# Patient Record
Sex: Male | Born: 1985 | Race: White | Hispanic: No | Marital: Married | Smoking: Never smoker
Health system: Southern US, Community
[De-identification: ages and names within clinical notes are randomized; demographics above are authoritative.]

## PROBLEM LIST (undated history)

## (undated) HISTORY — PX: TYMPANOSTOMY TUBE PLACEMENT: SHX32

## (undated) HISTORY — PX: HERNIA REPAIR: SHX51

## (undated) HISTORY — PX: ANKLE SURGERY: SHX546

---
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
## 2007-10-11 ENCOUNTER — Emergency Department (HOSPITAL_COMMUNITY): Admission: EM | Admit: 2007-10-11 | Discharge: 2007-10-12 | Payer: Self-pay | Admitting: Emergency Medicine

## 2007-10-12 ENCOUNTER — Encounter: Payer: Self-pay | Admitting: Family Medicine

## 2007-10-12 ENCOUNTER — Inpatient Hospital Stay (HOSPITAL_COMMUNITY): Admission: EM | Admit: 2007-10-12 | Discharge: 2007-10-18 | Payer: Self-pay | Admitting: Emergency Medicine

## 2007-10-13 ENCOUNTER — Encounter: Payer: Self-pay | Admitting: Family Medicine

## 2007-10-14 ENCOUNTER — Ambulatory Visit: Payer: Self-pay | Admitting: Internal Medicine

## 2007-10-15 ENCOUNTER — Encounter: Payer: Self-pay | Admitting: Family Medicine

## 2007-10-18 ENCOUNTER — Encounter: Payer: Self-pay | Admitting: Family Medicine

## 2007-10-19 DIAGNOSIS — Z87898 Personal history of other specified conditions: Secondary | ICD-10-CM

## 2007-10-20 ENCOUNTER — Ambulatory Visit: Payer: Self-pay | Admitting: Family Medicine

## 2007-10-22 ENCOUNTER — Telehealth (INDEPENDENT_AMBULATORY_CARE_PROVIDER_SITE_OTHER): Payer: Self-pay | Admitting: *Deleted

## 2007-10-25 ENCOUNTER — Telehealth: Payer: Self-pay | Admitting: Internal Medicine

## 2007-10-25 ENCOUNTER — Encounter: Payer: Self-pay | Admitting: Family Medicine

## 2008-02-07 ENCOUNTER — Ambulatory Visit: Payer: Self-pay | Admitting: Family Medicine

## 2008-02-08 ENCOUNTER — Telehealth: Payer: Self-pay | Admitting: Family Medicine

## 2008-02-08 ENCOUNTER — Encounter: Payer: Self-pay | Admitting: Family Medicine

## 2008-03-27 ENCOUNTER — Emergency Department (HOSPITAL_COMMUNITY): Admission: EM | Admit: 2008-03-27 | Discharge: 2008-03-27 | Payer: Self-pay | Admitting: Emergency Medicine

## 2008-04-24 ENCOUNTER — Encounter: Payer: Self-pay | Admitting: Family Medicine

## 2008-04-24 ENCOUNTER — Telehealth: Payer: Self-pay | Admitting: Family Medicine

## 2008-04-29 ENCOUNTER — Encounter: Payer: Self-pay | Admitting: Family Medicine

## 2008-05-03 ENCOUNTER — Telehealth: Payer: Self-pay | Admitting: Family Medicine

## 2008-08-04 ENCOUNTER — Ambulatory Visit: Payer: Self-pay | Admitting: Family Medicine

## 2008-08-04 LAB — CONVERTED CEMR LAB: Rapid Strep: NEGATIVE

## 2008-11-14 IMAGING — US US RENAL
1 series · 14 of 22 positions shown · non-contrast
Comparison: none

CLINICAL DATA: Left flank pain.  
 RENAL/URINARY TRACT ULTRASOUND:
TECHNIQUE: Complete ultrasound examination of the urinary tract was performed including evaluation of the kidneys, renal collecting systems, and urinary bladder.

[Series 1: unknown · 0.33mm/px · 14 of 22 slices shown]
[im 1/22]
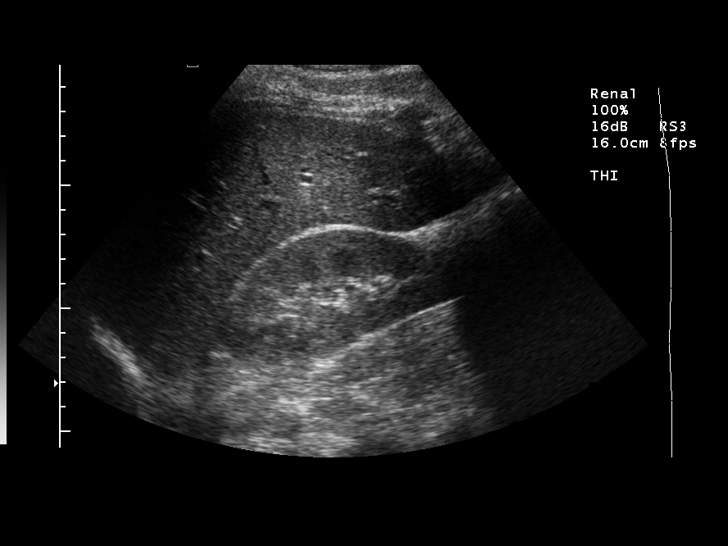
[im 3/22]
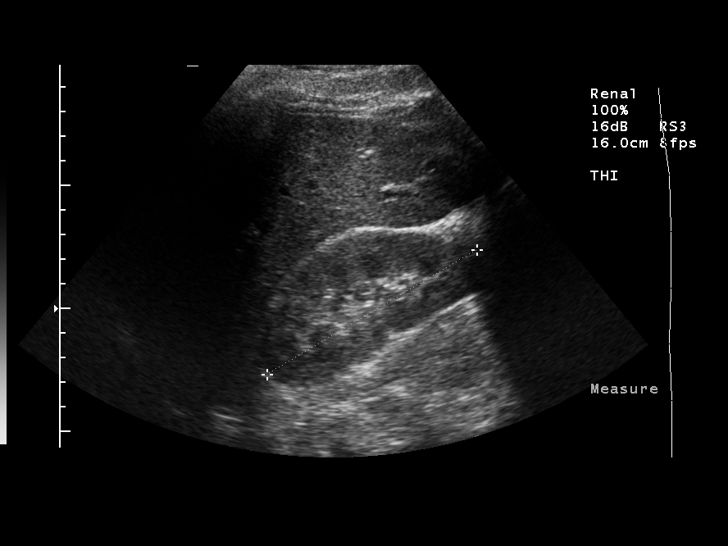
[im 4/22]
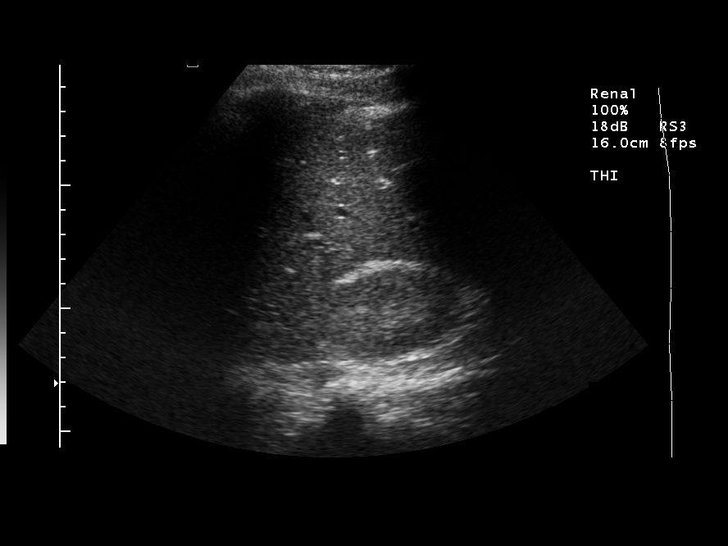
[im 6/22]
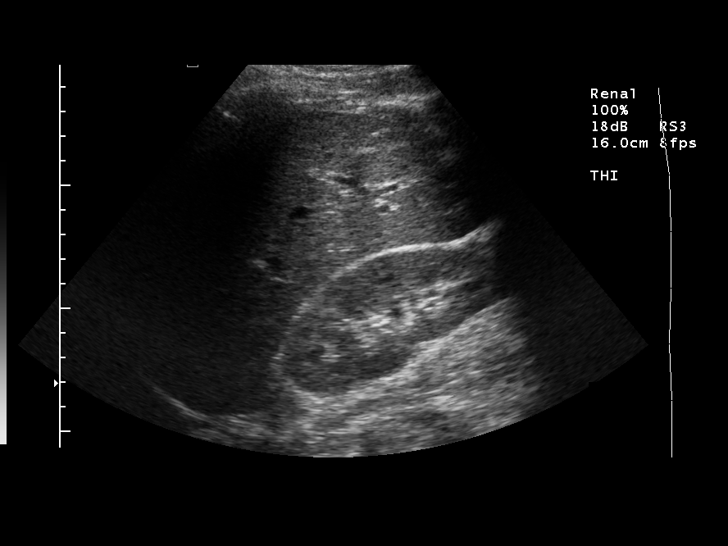
[im 8/22]
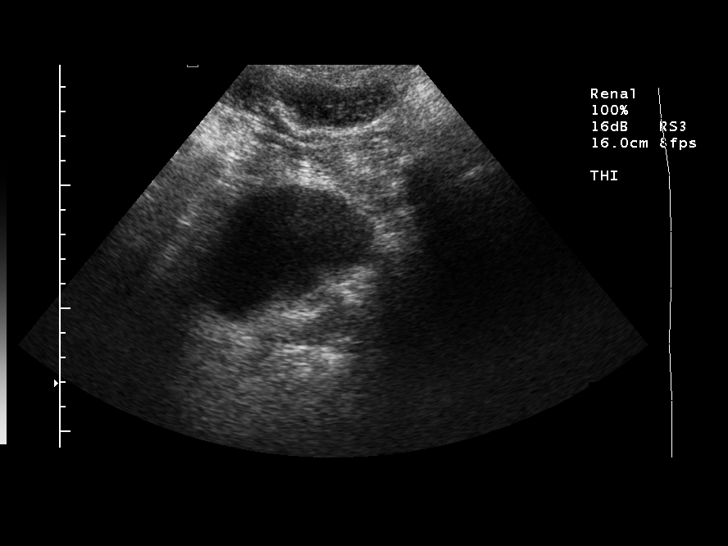
[im 9/22]
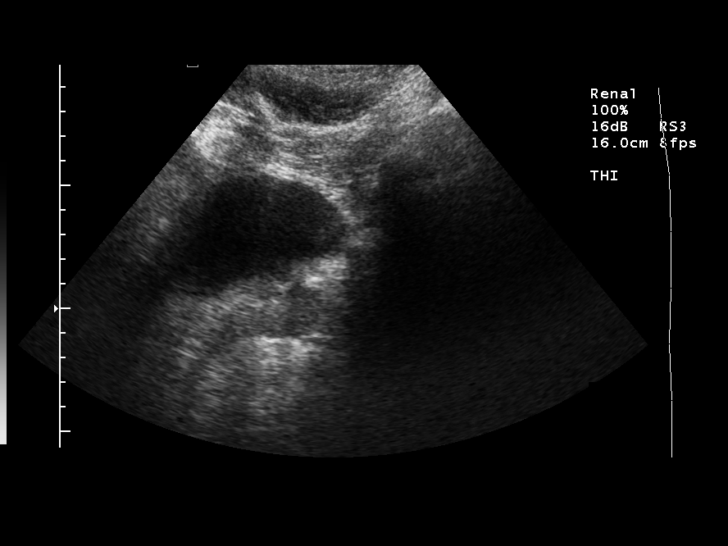
[im 11/22]
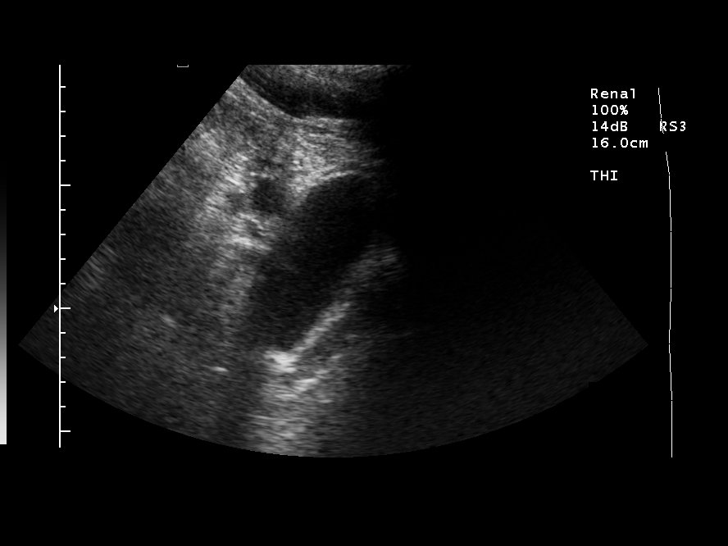
[im 12/22]
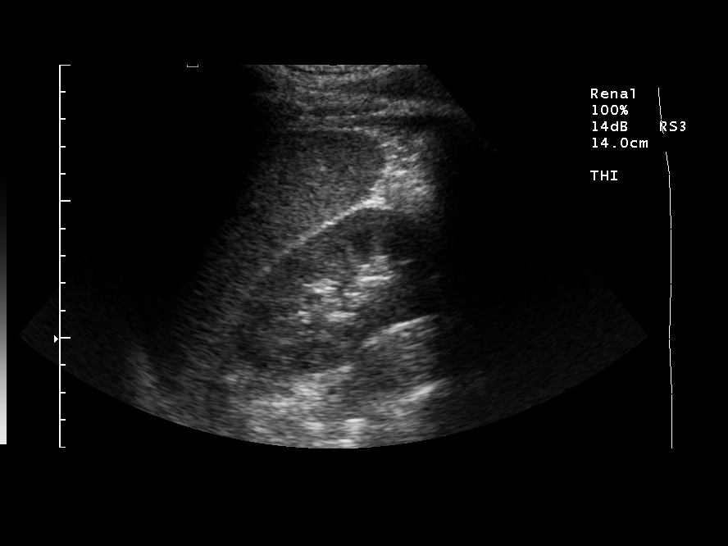
[im 14/22]
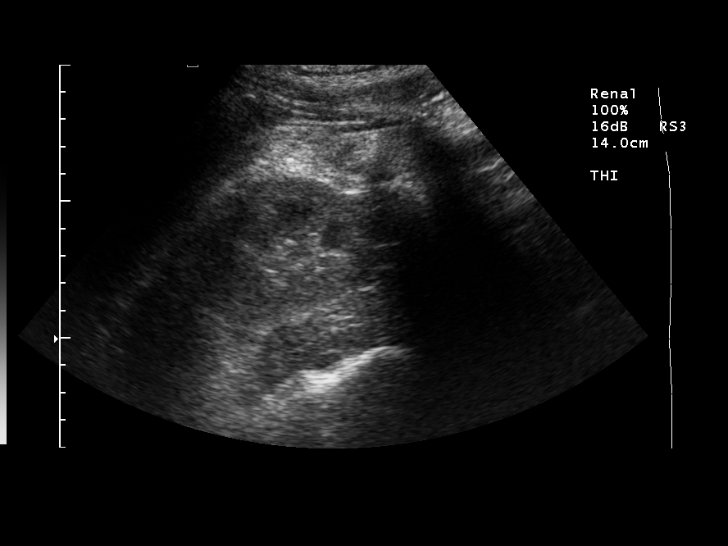
[im 15/22]
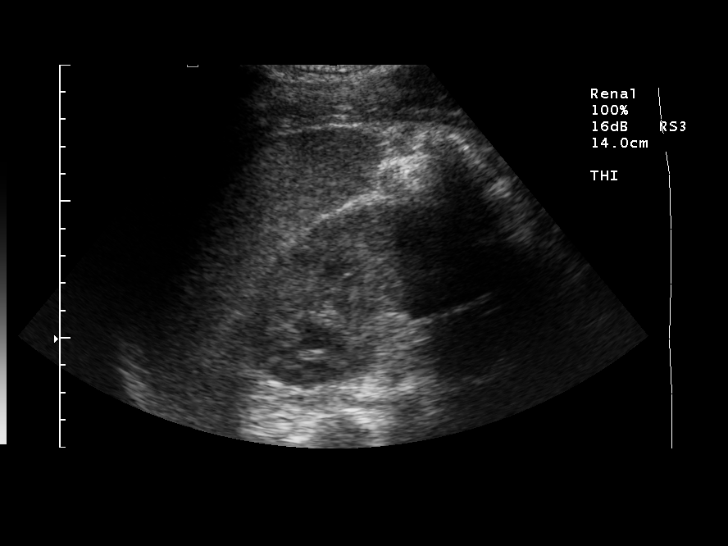
[im 17/22]
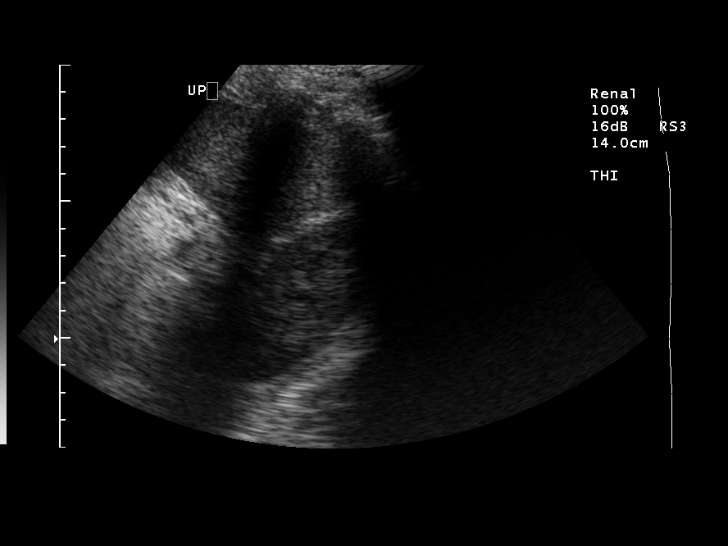
[im 19/22]
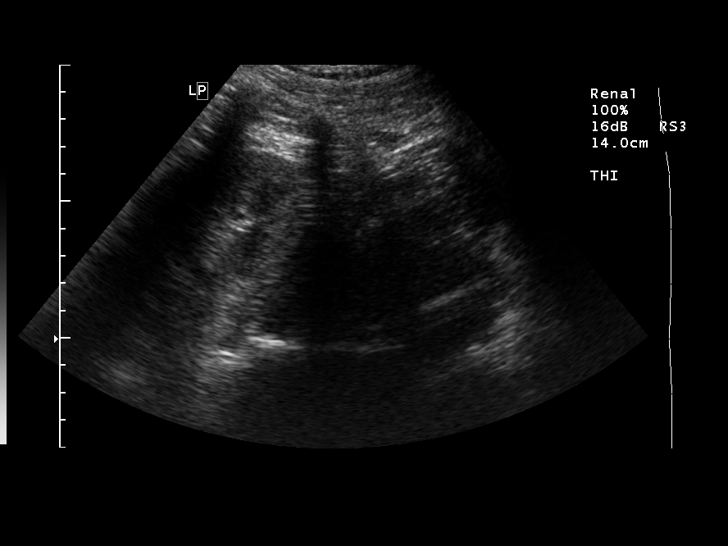
[im 20/22]
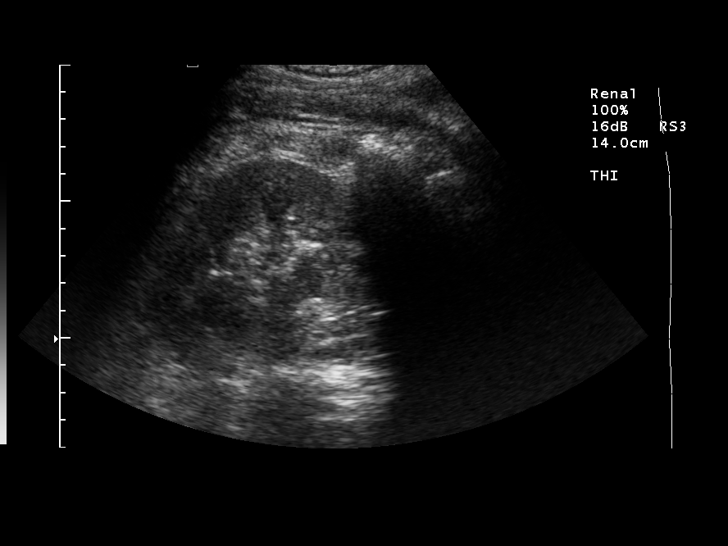
[im 22/22]
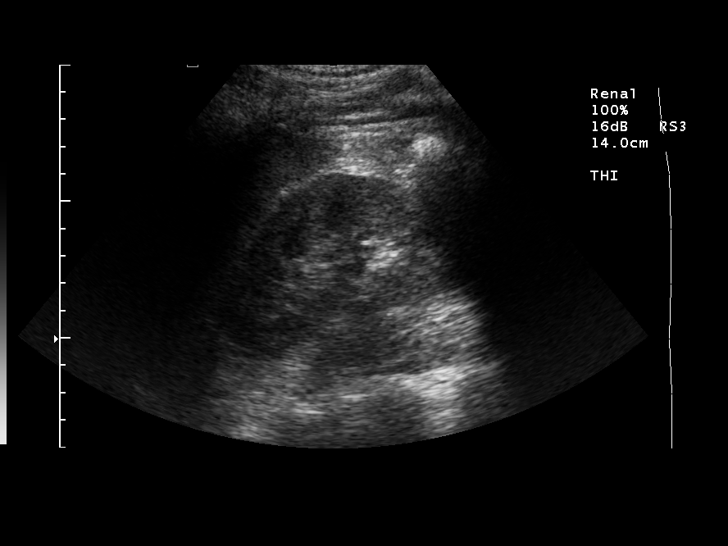

[14 of 22 positions shown; findings below may reference images not displayed]

FINDINGS: The right kidney is 10.1 cm in length and the left kidney 10.0 cm in length.  There is no hydronephrosis and there are no contour abnormalities.  The urinary bladder has a normal appearance.
IMPRESSION: Normal bilateral renal ultrasound.

## 2008-11-14 IMAGING — CT CT PELVIS W/ CM
2 of 5 series · 13 of 32 positions shown, 18 images · IV contrast (100 ML OMNI 300)
Comparison: none

CLINICAL DATA: Left abdominal and pelvic pain and flank pain. 
 ABDOMEN CT WITH CONTRAST:
TECHNIQUE: Multidetector CT imaging of the abdomen was performed following the standard protocol during bolus administration of intravenous contrast.
 Contrast:  100 cc Omnipaque 300 and oral contrast.
TECHNIQUE: Multidetector CT imaging of the pelvis was performed following the standard protocol during bolus administration of intravenous contrast.

[Series 2: routine abdomen · axial · 0.75mm/px · z∈[-480,-180]mm · 5 of 92 slices shown, 10 images]
[im 16/92  soft-tissue]
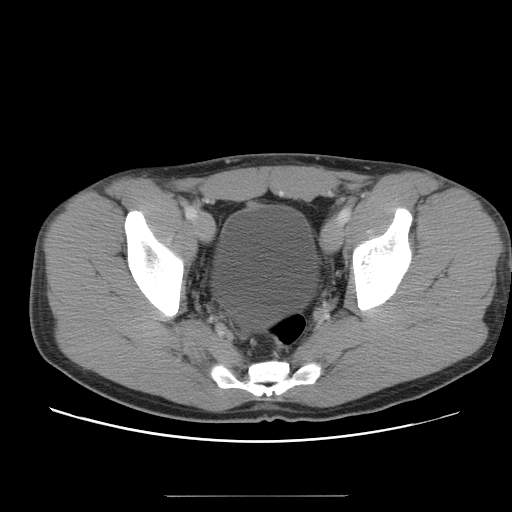
[im 16/92  bone]
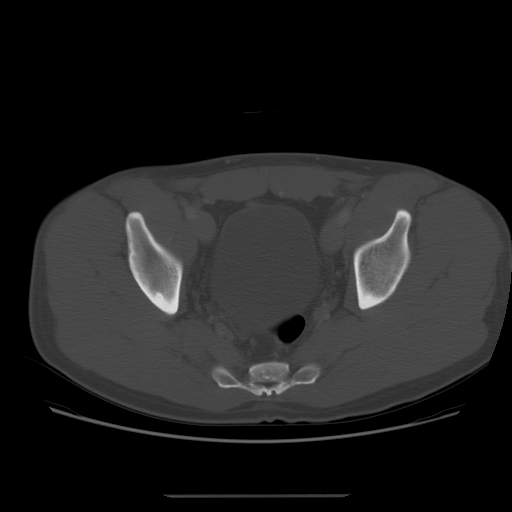
[im 31/92  soft-tissue]
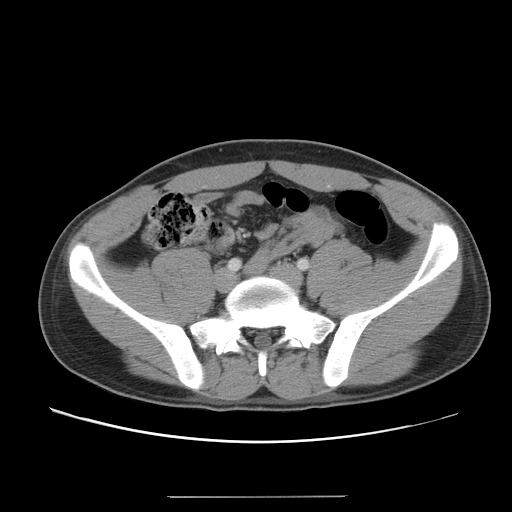
[im 31/92  lung]
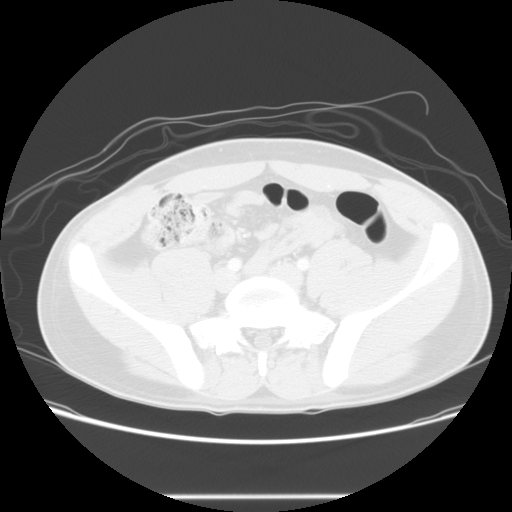
[im 46/92  soft-tissue]
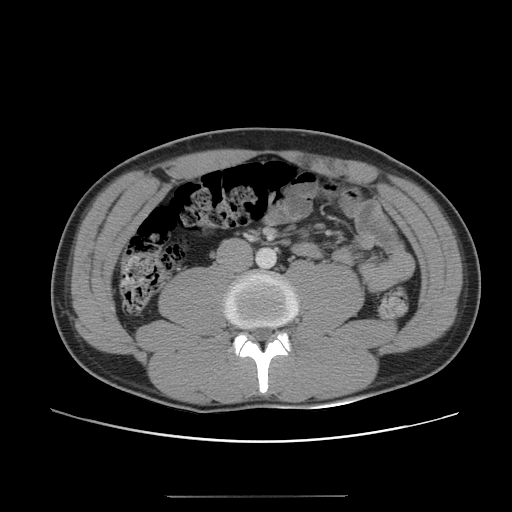
[im 46/92  lung]
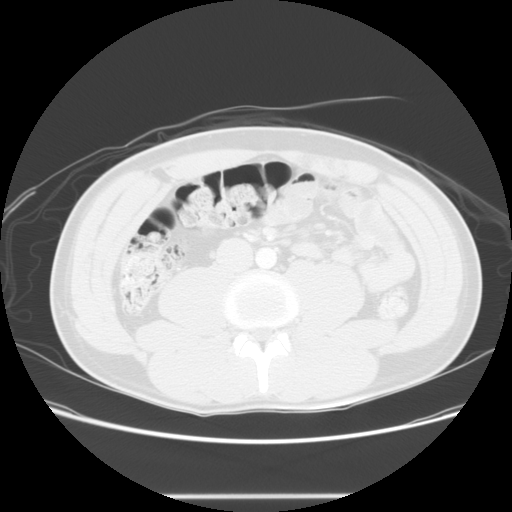
[im 61/92  soft-tissue]
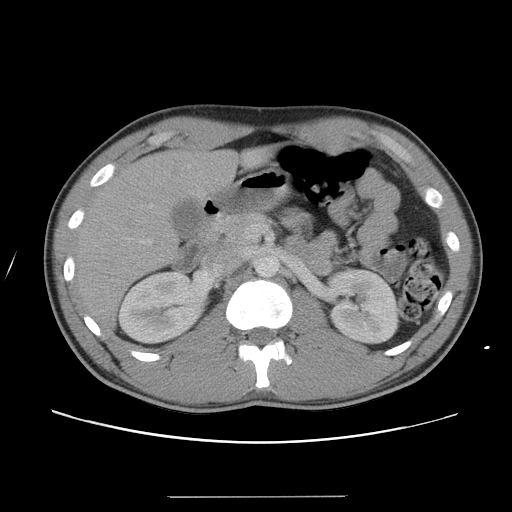
[im 61/92  lung]
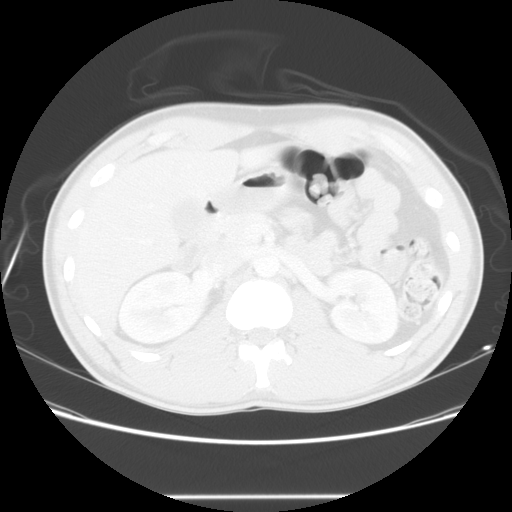
[im 76/92  soft-tissue]
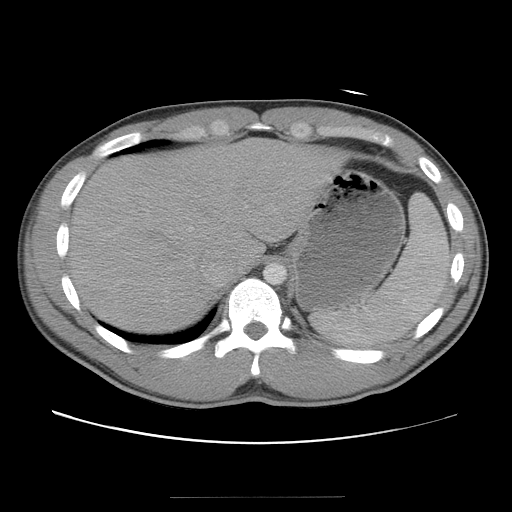
[im 76/92  lung]
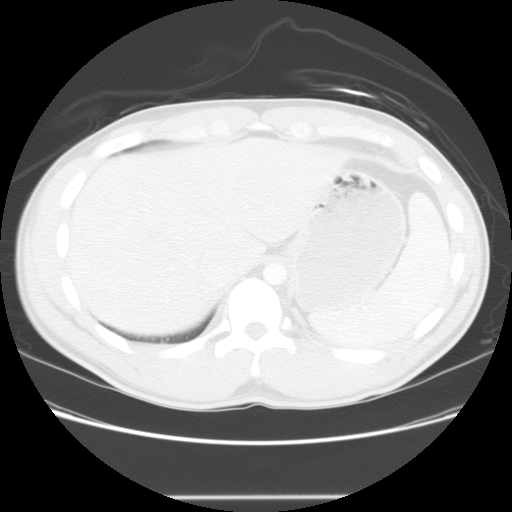

[Series 400: reformatted · sagittal · 0.91mm/px · 8 of 165 slices shown]
[im 15/165  soft-tissue]
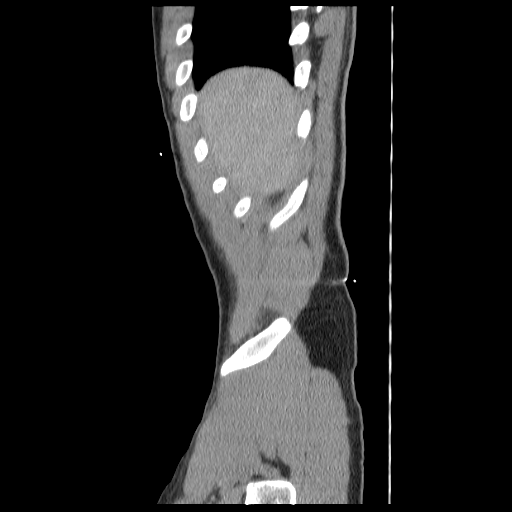
[im 30/165  soft-tissue]
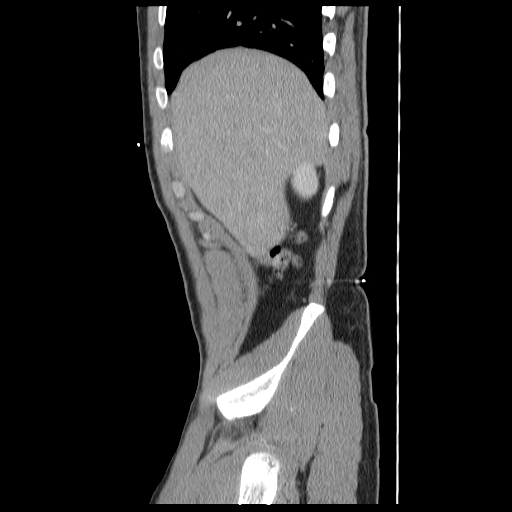
[im 60/165  soft-tissue]
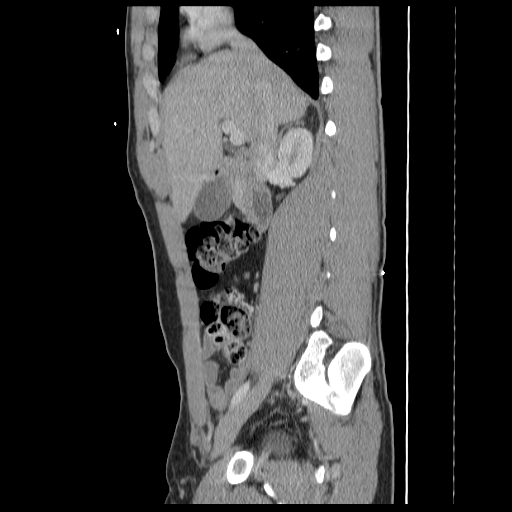
[im 75/165  soft-tissue]
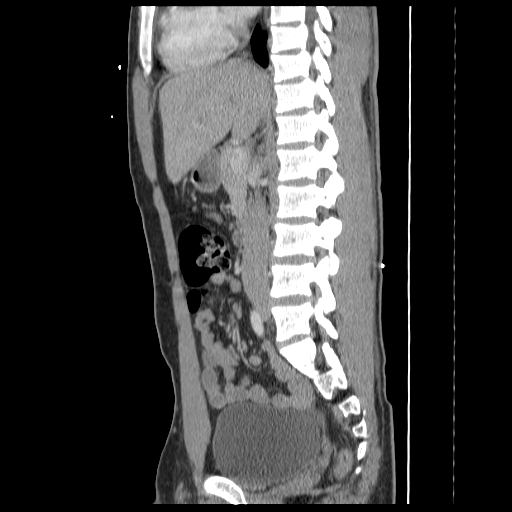
[im 90/165  soft-tissue]
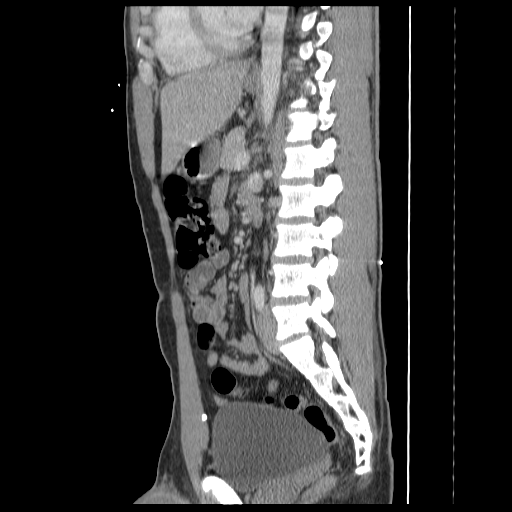
[im 105/165  soft-tissue]
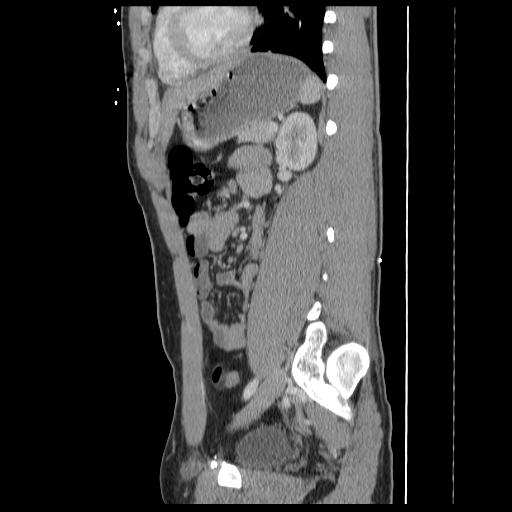
[im 135/165  soft-tissue]
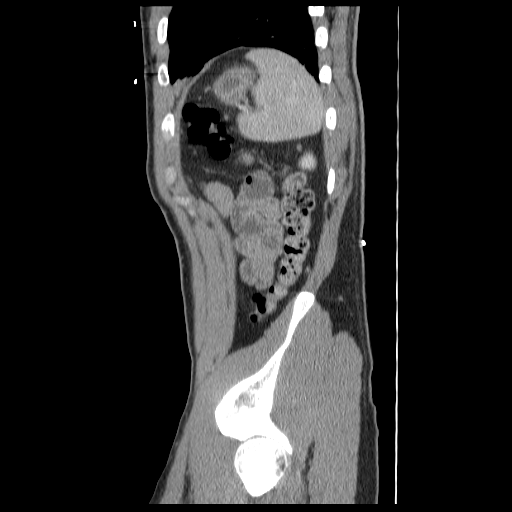
[im 150/165  soft-tissue]
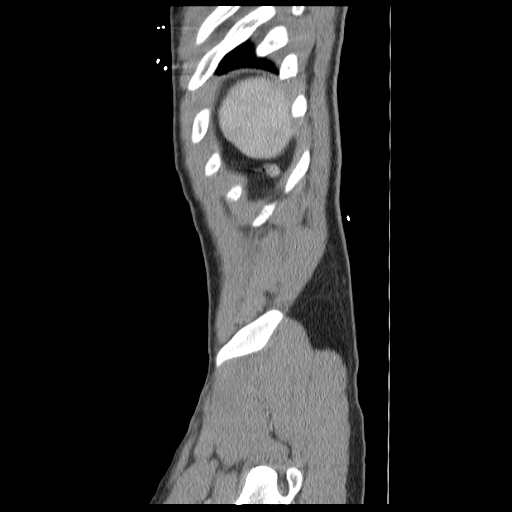

[13 of 32 positions shown; findings below may reference images not displayed]

FINDINGS: The liver, spleen, adrenal glands, kidneys, pancreas, and gallbladder are unremarkable.  The renal veins are patent.  There is no evidence of free fluid, enlarged lymph nodes, abdominal aortic aneurysm, or biliary dilatation.  Shotty mesenteric lymph nodes are likely reactive.  Visualized bowel is unremarkable.
IMPRESSION: No acute abnormalities. 
 PELVIS CT WITH CONTRAST:
FINDINGS: The bladder and bowel are unremarkable.  Evidence of pelvic wall graft noted.  No free fluid or enlarged lymph nodes.  The visualized bony structures are within normal limits.
IMPRESSION: No acute abnormalities.

## 2008-11-15 ENCOUNTER — Ambulatory Visit: Payer: Self-pay | Admitting: Family Medicine

## 2008-11-20 LAB — CONVERTED CEMR LAB: GC Probe Amp, Urine: NEGATIVE

## 2009-07-09 ENCOUNTER — Encounter: Payer: Self-pay | Admitting: Family Medicine

## 2009-07-10 ENCOUNTER — Observation Stay (HOSPITAL_COMMUNITY): Admission: EM | Admit: 2009-07-10 | Discharge: 2009-07-10 | Payer: Self-pay | Admitting: Emergency Medicine

## 2009-07-25 ENCOUNTER — Ambulatory Visit: Payer: Self-pay | Admitting: Family Medicine

## 2009-07-25 DIAGNOSIS — M549 Dorsalgia, unspecified: Secondary | ICD-10-CM | POA: Insufficient documentation

## 2009-12-11 ENCOUNTER — Telehealth: Payer: Self-pay | Admitting: Family Medicine

## 2009-12-26 ENCOUNTER — Encounter: Payer: Self-pay | Admitting: Family Medicine

## 2010-02-15 ENCOUNTER — Inpatient Hospital Stay (HOSPITAL_COMMUNITY): Admission: EM | Admit: 2010-02-15 | Discharge: 2010-02-16 | Payer: Self-pay | Admitting: Emergency Medicine

## 2010-02-16 ENCOUNTER — Encounter: Payer: Self-pay | Admitting: Family Medicine

## 2010-02-18 ENCOUNTER — Encounter: Payer: Self-pay | Admitting: Family Medicine

## 2010-02-19 ENCOUNTER — Encounter: Payer: Self-pay | Admitting: Family Medicine

## 2010-02-25 ENCOUNTER — Telehealth: Payer: Self-pay | Admitting: Family Medicine

## 2010-03-15 ENCOUNTER — Encounter: Admission: RE | Admit: 2010-03-15 | Discharge: 2010-03-15 | Payer: Self-pay

## 2010-12-03 NOTE — Letter (Signed)
Summary: Encounter Notice/[facility]  Encounter Notice/[facility]   Imported By: Lanelle Bal 02/27/2010 11:02:13  _____________________________________________________________________  External Attachment:    Type:   Image     Comment:   External Document

## 2010-12-03 NOTE — Miscellaneous (Signed)
  Clinical Lists Changes  Medications: Added new medication of PERCOCET 5-325 MG TABS (OXYCODONE-ACETAMINOPHEN) one tab by mouth every 4-6 hrs as needed muscle pain - Signed Rx of PERCOCET 5-325 MG TABS (OXYCODONE-ACETAMINOPHEN) one tab by mouth every 4-6 hrs as needed muscle pain;  #20 x 0;  Signed;  Entered by: Shaune Leeks MD;  Authorized by: Shaune Leeks MD;  Method used: Print then Give to Patient  Pt given script at [facility] on discharge, no str given. Script rewritten.  Shaune Leeks MD  February 19, 2010 2:50 PM   Prescriptions: PERCOCET 5-325 MG TABS (OXYCODONE-ACETAMINOPHEN) one tab by mouth every 4-6 hrs as needed muscle pain  #20 x 0   Entered and Authorized by:   Shaune Leeks MD   Signed by:   Shaune Leeks MD on 02/19/2010   Method used:   Print then Give to Patient   RxID:   [id number]

## 2010-12-03 NOTE — Consult Note (Signed)
Summary: [facility]  [facility]   Imported By: Lanelle Bal 01/11/2010 12:44:01  _____________________________________________________________________  External Attachment:    Type:   Image     Comment:   External Document

## 2010-12-03 NOTE — Letter (Signed)
Summary: [facility]-Dr. Theodosia Paling  [facility]-Dr. Theodosia Paling   Imported By: Maryln Gottron 02/28/2010 14:59:45  _____________________________________________________________________  External Attachment:    Type:   Image     Comment:   External Document

## 2010-12-03 NOTE — Progress Notes (Signed)
Summary: Discharge summary  Phone Note Other Incoming   Caller: [facility] pt encounter notification Summary of Call: Discharge summary you requested is on your shelf in the in box. Thank you.Lewanda Rife LPN  February 25, 2010 11:16 AM   Follow-up for Phone Call        I reviewed them and tagged to scan  thanks  Follow-up by: Judith Part MD,  February 25, 2010 12:59 PM

## 2010-12-03 NOTE — Progress Notes (Signed)
Summary: wants referral to  pain management  Phone Note Call from Patient Call back at [phone]   Caller: Patient Call For: Judith Part MD Summary of Call: Pt is asking for a referral to go to [facility] pain management clinic for a one time nerve block injection in his back.  He says he has had 2 injections in his back before, but not at this clinic. Initial call taken by: Lowella Petties CMA,  December 11, 2009 3:59 PM  Follow-up for Phone Call        will do ref and route to [facility]   Follow-up by: Judith Part MD,  December 11, 2009 4:39 PM  Additional Follow-up for Phone Call Additional follow up Details #1::        Referral form faxed to [facility].Patient called and notified that referral was done. Waiting for an appt. Carlton Adam  December 12, 2009 11:30 AM  Additional Follow-up by: Carlton Adam,  December 12, 2009 11:30 AM

## 2010-12-03 NOTE — Medication Information (Signed)
Summary: Rx for [facility]  Rx for [facility]   Imported By: Beau Fanny 02/20/2010 10:00:09  _____________________________________________________________________  External Attachment:    Type:   Image     Comment:   External Document

## 2011-01-21 LAB — POCT I-STAT, CHEM 8
BUN: 14 mg/dL (ref 6–23)
Calcium, Ion: 1.21 mmol/L (ref 1.12–1.32)
HCT: 46 % (ref 39.0–52.0)
Sodium: 142 mEq/L (ref 135–145)
TCO2: 27 mmol/L (ref 0–100)

## 2011-01-21 LAB — CULTURE, BLOOD (ROUTINE X 2)

## 2011-01-21 LAB — URINALYSIS, ROUTINE W REFLEX MICROSCOPIC
Glucose, UA: NEGATIVE mg/dL
Ketones, ur: NEGATIVE mg/dL
Nitrite: NEGATIVE
Protein, ur: NEGATIVE mg/dL
pH: 5.5 (ref 5.0–8.0)

## 2011-02-07 LAB — URINALYSIS, ROUTINE W REFLEX MICROSCOPIC
Glucose, UA: NEGATIVE mg/dL
Nitrite: NEGATIVE
Specific Gravity, Urine: 1.027 (ref 1.005–1.030)
pH: 6 (ref 5.0–8.0)

## 2011-02-07 LAB — DIFFERENTIAL
Eosinophils Absolute: 0.1 10*3/uL (ref 0.0–0.7)
Eosinophils Relative: 1 % (ref 0–5)
Lymphocytes Relative: 7 % — ABNORMAL LOW (ref 12–46)
Lymphs Abs: 0.8 10*3/uL (ref 0.7–4.0)
Monocytes Relative: 5 % (ref 3–12)

## 2011-02-07 LAB — CBC
HCT: 49.7 % (ref 39.0–52.0)
MCV: 90.2 fL (ref 78.0–100.0)
Platelets: 208 10*3/uL (ref 150–400)
RBC: 5.51 MIL/uL (ref 4.22–5.81)
WBC: 10.9 10*3/uL — ABNORMAL HIGH (ref 4.0–10.5)

## 2011-02-07 LAB — BASIC METABOLIC PANEL
BUN: 14 mg/dL (ref 6–23)
Chloride: 103 mEq/L (ref 96–112)
GFR calc Af Amer: >60 mL/min (ref >60)
GFR calc non Af Amer: >60 mL/min (ref >60)
Potassium: 3.6 mEq/L (ref 3.5–5.1)
Sodium: 136 mEq/L (ref 135–145)

## 2011-03-18 NOTE — Consult Note (Signed)
[name], [name]              ACCOUNT NO.:  [id number]   MEDICAL RECORD NO.:  [id number]          PATIENT TYPE:  INP   LOCATION:  6733                         FACILITY:  [facility]   PHYSICIAN:  Marlan Palau, M.D.  DATE OF BIRTH:  1986/10/18   DATE OF CONSULTATION:  10/15/2007  DATE OF DISCHARGE:                                 CONSULTATION   REFERRING PHYSICIAN:  Dr. Vikki Ports A. Leschber.   HISTORY OF PRESENT ILLNESS:  [name] is a 25 year old right-  handed white male, born 1986-03-24, with a history of problems with  the left side pain that has been present for about 5 days.  The patient  awoke with some left side discomfort 5 days prior to this consultation.  The patient has had intermittent pain since that time that comes on and  is quite intense for 20-40 minutes and then abates.  The patient may  have multiple episodes during the day.  The patient has had some reports  of low-grade fevers with the above events.  Patient has undergone a  multitude of tests to include a CT of the pelvis, CT of abdomen, renal  ultrasound and MRI scan of the thoracic spine, all of which have been on  remarkable.  CT angiogram of the chest has been negative to rule out  pulmonary embolism.  Urology has seen this patient, but no renal calculi  have been documented to explain his pain.  For this reason, neurology  consultation was obtained to rule out a neurologic cause of his  underlying pain problem.  The patient has been constipated and normally  has 1-2 bowel movements a day, but has not had any bowel movements for 5  days.  The patient had some problems with voiding the bladder at times.  No numbness of the extremities has been noted, no gait disturbance, no  weakness of extremities.   PAST MEDICAL HISTORY:  Significant for:  1. History of left side pain.  2. Left hernia surgery in the past.  3. Right foot tendon surgery in the past.  4. Tonsillectomy in the past.   CURRENT  MEDICATIONS:  1. Benadryl if needed.  2. Dilaudid p.r.n.  3. Toradol 10 mg q.6 h. if needed.  4. Robaxin 500 mg q.6 h.  5. Protonix 40 mg daily.  6. Flomax 0.4 mg nightly.  7. Diazepam if needed   ALLERGIES:  The patient has allergy to SULFA DRUGS.   HABITS:  Does not smoke or drink.   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   FAMILY MEDICAL HISTORY:  Both parents are alive and well; both have had  gallbladder resections.  Father has renal calculi, hypertension and has  had detached retina.  Maternal grandfather died of an MI at age 4.  History of hypertension is in the family.   REVIEW OF SYSTEMS:  Notable for some occasional low-grade fevers.  The  patient has had headaches associated with this severe left-sided pain.  Denies neck pain.  Denies shortness of breath, chest pains or true  abdominal pains, nausea or vomiting.  The patient has had some hesitancy  with the bladder.  Constipation as above.  Denies numbness or weakness  on the arms or leg, gait disturbance, blackout episodes or confusion  spells.   PHYSICAL EXAMINATION:  VITALS:  Blood pressure is 128/74, heart rate 55,  respiratory 20, temperature afebrile.  GENERAL:  This patient is a well-developed white male who is alert and  cooperative at the time of examination.  HEENT:  Head is atraumatic.  EYES:  Pupils are equal, round and reactive to light.  Disks are flat  bilaterally.  NECK:  Supple.  No carotid bruits are noted.  RESPIRATORY:  Clear.  CARDIOVASCULAR:  Examination reveals a regular rate and rhythm with no  obvious murmurs or rubs noted.  EXTREMITIES:  Without significant edema.  NEUROLOGIC:  Cranial nerves as above.  Facial symmetry is present.  The  patient has good sensation of face to pinprick and soft touch  bilaterally.  There is good strength of the facial muscles and muscles  of head turning and  shoulder shrugs bilaterally.  Speech is well  enunciated and not aphasic.  Motor testing reveals 5/5 strength in all  4's.  Good and symmetric motor tone is noted throughout.  Sensory  testing is intact to pinprick, soft touch and vibratory sensation  throughout.  Position sense is intact in all 4.  No evidence of  extinction is noted.  Patient has good finger-nose-finger and heel-to-  shin.  Gait was not tested.  No drift is seen.  Deep tendon reflexes are  symmetric and normal.  Toes are downgoing bilaterally.  No sensory  levels is noted on the back.  No significant pain to percussion in the  left flank is noted.   LABORATORY VALUES:  Notable for a white count of 6, hemoglobin 13.6,  hematocrit of 40.1, MCV of 88.2, platelets of 257,000; sed rate of 18;  sodium 139, potassium 3.5, chloride of 102, CO2 of 30, glucose of 123,  BUN of 3, creatinine 1.23, total bilirubin 1.5, alkaline phosphatase of  50, SGOT of 23, SGPT of 25, total protein 6.3, albumin of 3.1, calcium  9.3, amylase 51, lipase of 21, magnesium level of 21; CK level of 97,  LDH of 167, MB fraction of 0.8.   IMPRESSION:  Intermittent left side pain, etiology unclear.   This patient has intermittent severe pain that is associated with  relative pain-free periods.  The above pain problem is consistent with a  peristaltic pain that may be seen with renal calculi or with partial  bowel obstruction.  The patient has no evidence of neurologic disease,  has had a thoracic spine MRI that is unremarkable.  The sensory  examination is not consistent with radiculopathy or a zoster-type pain  syndrome.  The pain described is clearly a crampy, intermittent severe  pain.   PLAN:  I would recommend rather alleviating of constipation to see if  some of the peristaltic-type pain improves.  I will follow the patient's  course while in-house.  No further neurologic workup as indicated.      Marlan Palau, M.D.  Electronically  Signed     CKW/MEDQ  D:  10/15/2007  T:  10/17/2007  Job:  [id number]

## 2011-03-18 NOTE — Consult Note (Signed)
[name], [name]              ACCOUNT NO.:  [id number]   MEDICAL RECORD NO.:  [id number]          PATIENT TYPE:  INP   LOCATION:  6733                         FACILITY:  [facility]   PHYSICIAN:  Juan-Carlos Monguilod, M.D.DATE OF BIRTH:  1986-08-22   DATE OF CONSULTATION:  10/14/2007  DATE OF DISCHARGE:                                 CONSULTATION   REASON FOR CONSULTATION:  Pain management recommendations. This Nurse  Practitioner, Lorinda Creed, reviewed medical records, received report  from team, assessed patient, and then met with the patient and his  parents at the bedside to discuss pain management strategies.   GOALS/RECOMMENDATIONS:  1. Pain relief. Prior to this hospitalization, the patient has had no      pain at baseline.   RECOMMENDATIONS:  1. Toradol 10 mg 1 p.o. q.6 hours.  2. Robaxin 500 mg 1 p.o. q.6 hours.  3. K-pad.  4. Dilaudid IV 2 mg only for acute episodes of excruciating pain. May      be repeated in 15 minutes, not to exceed 10 mg in a 4 hour period.  5. Senna 2 tabs qhs.   HISTORY OF PRESENT ILLNESS:  This is a 26 year old white male who  developed acute left side and flank pain on Monday. He came to the  emergency room and was discharged home, only to return shortly and be  admitted. The patient describes the pain as excruciating and stabbing.  It comes on without warning. Between episodes, the area of concern is  reported to have a dull ache. The patient has been worked up quite  extensively and all imaging studies and x-rays have been negative. All  attempts at pain control and implemented. Palliative care will continue  to support holistically and assist in pain management attempts and  strategies.   PAST MEDICAL HISTORY:  1. Multiple bone fractures.  2. Inguinal hernia repair.  3. Tendon repair to the foot.  4. Adenoid surgery in the past.  5. Wisdom teeth removal.  6. Tubes as a child.   SOCIAL HISTORY:  The patient denies use of tobacco,  alcohol, or street  drugs of any kind. He is a Medical laboratory scientific officer.   FAMILY HISTORY:  Remarkable for his father who has a history of gout and  kidney stones. Mother has a history of pleurisy.   REVIEW OF SYSTEMS:  Per patient, he was perfectly healthy and had no  positive symptoms. Recent onset of left side and flank pain as noted  above.   ALLERGIES:  SULFA, MORPHINE.   MEDICATIONS:  Valium, hydromorphone, Flomax, Dulcolax, Toradol,  Lorazepam, Protonix, and Pyridium.   PHYSICAL EXAMINATION:  GENERAL:  This is a well developed white male who  is sitting up in bed. Alert and oriented times three.  VITAL SIGNS:  Blood pressure 125/63, temperature 97.9, pulse 80,  respiratory rate 20. O2 saturations on room air 97%.  HEENT:  Normocephalic. Pupils are equal and reactive.  LUNGS:  Clear to auscultation.  ABDOMEN:  Soft, nontender, positive bowel sounds. The patient reports an  area directly below the left axilla, midway between axilla and hip  bone.  Area measures approximately 18 cm x 10 cm that on palpation, generates  tenderness.  EXTREMITIES:  Without erythema or edema.   LABORATORY DATA:  As of October 14, 2007, sodium 139, potassium 3.5,  chloride 102, CO2 30, glucose 123, BUN 3, creatinine 1.25, bilirubin  1.25. Total protein 6.3. Albumin blood 3.1. Calcium 9.3. White blood  cell count 6.0. Red blood cells is 4.55. Hemoglobin 13.6. Hematocrit  40.1. Platelets 257,000.   DURATION OF CONSULTATION:  Total time spent on the unit, 90 minutes.  Counseling and coordination of care composed of 50% of greater portion  of this interaction. Concept of pain management strategies were  discussed. Values and goals of care important to the family and patient  were elicited. Palliative care will support holistically and attempt to  assist in coordination of care for this patient while hospitalized.  Family is encouraged to call with questions or concerns.      Herbert Pun,  NP      Rosanne Sack, M.D.  Electronically Signed    MCL/MEDQ  D:  10/15/2007  T:  10/17/2007  Job:  [id number]   cc:   [facility]
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
## 2011-03-18 NOTE — Consult Note (Signed)
[name], [name]              ACCOUNT NO.:  [id number]   MEDICAL RECORD NO.:  [id number]          PATIENT TYPE:  INP   LOCATION:  6733                         FACILITY:  [facility]   PHYSICIAN:  Currie Paris, M.D.DATE OF BIRTH:  [birth year]   DATE OF CONSULTATION:  10/15/2007  DATE OF DISCHARGE:                                 CONSULTATION   REFERRING PHYSICIAN:  Dr. Vikki Ports A. Leschber.   PRIMARY CARE PHYSICIAN:  Dr. Milinda Antis.   UROLOGIST:  Dr. Logan Bores.   NEUROLOGIST:  Dr. Anne Hahn.   ORTHOPEDIST:  Dr. Madelon Lips.   REASON FOR CONSULTATION:  Unexplained left flank pain in patient with  history of prior left inguinal hernia repair with mesh in 2007.   HISTORY OF PRESENT ILLNESS:  [name] is an otherwise healthy 25-  year-old male who works for the [employer].  He awakened from his  sleep Monday morning because of severe left flank pain, very focal,  nonradiating, increased with breathing and was very sharp in nature.  He  attempted to go to work, was taking Tylenol with no real relief in pain,  was able to work without any worsening of the pain, but pain seemed to  become much worse as the evening wore on and he was eventually taken to  the emergency room for evaluation.  It was felt at that time he had of  musculoskeletal pain, so he was discharged.  Since that time, the  patient has had no resolution in pain.  The pain has worsened to the  point where the patient had significant shortness of breath and anxiety  because of the pain, simply because of pain would cause him to have an  inability to take in a good deep breath and he felt like he could not  breathe.  He was subsequently admitted by the internist on October 12, 2007.  Since that time, he has had an extensive workup involving ruling  out pulmonary embolus, urological etiology, intra-abdominal pathology.  He has had CTs done and ultrasounds done.  He has had multiple  consultations including with Orthopedics,  Urology and Neurology.  The  neurology evaluation was today and so far, no clear etiology to the pain  has been discovered.  He is scheduled to undergo repeat MRI today,  somewhat more detailed in the spinal area, as ordered per Neurology.  According to the patient, the patient is still having pretty much the  same pain.  He is using NSAIDS, muscle relaxants and narcotic pain  medications without any significant relief.  He is just very drowsy and  tired and again, he mainly notes it with taking in a deep breath. This  pain does not radiate down the leg, down the buttocks, does not radiate  across the lower back, does not radiate across the lower abdomen or into  the groin region.  He again describes it as a very sharp burning pain.  The patient had prior left inguinal hernia repair done in December 2007.  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  The patient did have a  laparoscopic repair with mesh in December 2007 by Dr. Ezzard Standing and has not  reported any symptoms such as discomfort or pain or bulging in the left  groin area.  In addition, the patient does have a history of prior  chickenpox, but has never had problems related to zoster or herpetic  neuralgia.   REVIEW OF SYSTEMS:  As above.  No nausea or vomiting.  No bloating.  No  dark or red stools.  No fevers.  No hematuria.  No dysuria.  No pain or  increase or change in back pain with voiding.   PAST MEDICAL HISTORY:  None.   PAST SURGICAL HISTORY:  1. Right pneumothorax.  2. Prior extremity fractures from other injuries/sports.  3. Left inguinal hernia repair, laparoscopic approach, with mesh in      December 2007 by Dr. Ovidio Kin.  4. Prior wisdom teeth extraction.  5. Tender repair of foot.  6. Myringotomy tubes as a child.   SOCIAL HISTORY:  He lives with his parents.  No alcohol.  No tobacco.   ALLERGIES:  SULFA and MORPHINE.   CURRENT  MEDICATIONS:  Include Colace, Flomax, Protonix, IV Dilaudid,  Toradol, Robaxin, Senokot, Tylenol, Zofran, Vicodin, Ativan, Valium and  Haldol as needed.   PHYSICAL EXAM:  GENERAL:  A pleasant, somewhat sedated male patient  relating frustration regarding ongoing left flank pain, very focal in  nature without any radiculopathy-type symptoms.  VITAL SIGNS:  Temperature 98.4, BP 160/89, pulse 73 and regular,  respirations 20.  NEUROLOGIC:  The patient is alert and oriented x3, moving all  extremities x4 without focal deficits.  Gait, ambulation and DTRs were  not assessed.  HEENT:  Head is normocephalic.  Sclerae not injected, anicteric.  NECK:  Supple.  No adenopathy.  CHEST:  Bilateral lung sounds clear to auscultation.  Respiratory effort  is nonlabored.  He is on room air.  CARDIAC:  S1-S2, no obvious rubs, murmurs or thrills.  No gallops.  No  JVD.  Pulses regular and non-tachycardiac.  ABDOMEN:  Soft, nontender and nondistended.  No obvious  hepatosplenomegaly, masses or bruits and no lower abdominal hernias  noted.  He has a tiny scars in the left lower quadrant consistent with  prior laparoscopic procedure.  EXTREMITIES:  Symmetrical in appearance without edema, cyanosis or  clubbing.  GENITOURINARY:  The patient has an obvious left inguinal ring defect  which is open on exam; I am able to insert the tips of 2 fingers up  through this defect and up into the inguinal canal at least 2 cm and I  am able to palpate bowel; this causes tenderness to the patient, but  does not reproduce the left flank pain or may the left flank pain any  worse.  This area does not appear to be incarcerated and is only tender  with aggressive pushing up into the inguinal canal; when the fingers are  removed, there is no more pain.  The right inguinal has no defects.  The  testes and scrotum are normally descended and normal in appearance and  he has normal hair distribution in the pelvic area.   On my  exam, the patient has a prominent external ring, but no hernia.  (STRECK)   LABORATORY DATA:  White count 6000, hemoglobin 13.6, platelets 257,000.  Potassium 3.5, creatinine 1.23, total bilirubin 1.5.  Urinalysis has  been completely normal x3 collections.   DIAGNOSTICS:  He has had  multiple procedures done this admission.  A CT  and the abdomen and pelvis with oral and IV contrast shows no acute  abdominal processes, no obvious areas of herniation or entrapment  visible, but a stable pelvic wall graft, no mention again of any type of  herniation or obstructive process.   IMPRESSION:  1. Left flank pain, very focal, uncertain etiology.  2. Left inguinal hernia with inguinal ring defect, status post prior      laparoscopic repair with mesh in 2007.   PLAN:  1. The patient does not have a henia.  The palpation in the inguinal      canal does not reproduce any left flank pain, so I do not feel this      is the cause of the left flank pain at this time.  Dr. Jamey Ripa will      discuss with Dr. Ezzard Standing.  The patient probably would benefit from      outpatient reevaluation for open repair later.  2. The patient has a history of prior chickenpox and has never had      herpes zoster.  Symptoms have only been present for 5 days, so this      could be early presentation of herpetic zoster pain.  Would      continue to monitor for eruption of vesicles.  3. At this time, I see no general surgical etiology for the pain, no      new recommendations other than elective follow up regarding the      hernia.      Allison L. Rennis Harding, N.P.      Currie Paris, M.D.  Electronically Signed    ALE/MEDQ  D:  10/15/2007  T:  10/17/2007  Job:  [id number]   cc:   Sandria Bales. Ezzard Standing, M.D.  Marne A. Milinda Antis, MD

## 2011-03-18 NOTE — Discharge Summary (Signed)
[name], [name]              ACCOUNT NO.:  [id number]   MEDICAL RECORD NO.:  [id number]          PATIENT TYPE:  INP   LOCATION:  6733                         FACILITY:  [facility]   PHYSICIAN:  Valerie A. Felicity Coyer, MDDATE OF BIRTH:  [birth year]   DATE OF ADMISSION:  10/12/2007  DATE OF DISCHARGE:  10/18/2007                               DISCHARGE SUMMARY   DISCHARGE DIAGNOSES:  1. Left sided paroxysmal flank pain, severe with unexplained etiology,      negative evaluation.  Please see review of test below.  2. History of left inguinal hernia repair.  3. Constipation and urinary retention presumably exacerbated by      narcotics and anxiety, resolved.  4. History of pneumothorax with multiple fractures.  5. History of adenoid surgery and wisdom teeth extraction.   DISCHARGE MEDICATIONS:  1. Flomax 0.4 mg once daily, dispense 30.  2. Indocin 25 mg one t.i.d. x3 days, then p.r.n., dispense 30.  3. Robaxin 500 mg one q.4-6 h. p.r.n. muscle spasm, dispense 40.  4. Protonix 40 mg once daily while on Indocin to protect stomach,      dispense 30.  5. Valium 5 mg one t.i.d. p.r.n. anxiety or spasm, dispense 20.  6. Norco 5/325 one q.4 h. p.r.n. moderate pain, dispense 20.  7. Dilaudid 2 mg one q.4 h. p.r.n. severe pain, dispense 20.  8. Stool softener over-the-counter (Dulcolax, Senna, etc.) one to two      at night as needed while on pain medication.   FOLLOW UP:  Hospital followup is scheduled with primary care physician,  Dr. Roxy Manns, for Wednesday this week, December 17 at 3:30 p.m., to  review the patient's pain symptoms.  The patient is also given a  prescription for outpatient PT and will call Delbert Harness to arrange.  Prescription has been provided for PT to evaluate and treat for left  flank pain.  Telephone number [phone].  The patient will also follow up  with neurologist, Dr. Logan Bores, on an as-needed basis.  Dr. Thurston Hole as  needed.   CONSULTATIONS:  1. Dr. Logan Bores of  urology.  2. Lorinda Creed, NP, of hospice and palliative care for pain      management.  3. Dr. Lesia Sago, neurology.  4. Dr. Cyndia Bent, general surgery.  5. Dr. Madelon Lips, on-call for Dr. Thurston Hole, for orthopedics.   PROCEDURES:  1. CT of abdomen and pelvis, first without and then with contrast each      negative.  2. Normal renal ultrasound.  3. Normal CT angiogram of the chest, negative for pulmonary embolus.  4. Normal thoracic spine.  5. Normal plain films of the lumbar spine and chest x-ray.  6. Negative MRI of the lumbar spine.  7. Bedside cystogram for urinary retention December 11, by Dr. Logan Bores,      unremarkable.   CONDITION ON DISCHARGE:  Medically stable.  No hemodynamic or  respiratory compromise and no episodes of his unexplained severe pain  for the last 36 hours.  PT evaluation pending at time of dictation.  Anticipate stable for discharge for outpatient PT evaluation and  treatment.   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  He underwent a left inguinal hernia repair in 2007, as well  as has a history of pneumothorax associated with chest trauma and  fractures.  He had been evaluated in the emergency room the day prior  for sharp paroxysmal spasm-like pain concerning for kidney stone.  On  December 8, a noncontrasted CT abdomen and pelvis was performed and  found no evidence of same.  The urinalysis was negative.  Other labs  were unremarkable and he had a normal D-dimer.  He was therefore sent  home for medical management given a prescription for Percocet, Valium  and Reglan.  However, within several hours of returning home, he had a  recurrence of the severe, left-sided flank pain which awoke him from  sleep, became worse with movement and unable to lay flat or catch his  breath, prompting return to the emergency room.  The pain was 5/10,   stabbing in nature across the left side.  Laboratory data was again  unremarkable with a negative D-dimer x2 sets, but he was referred for  admission for further evaluation and pain management.  We checked an MRI  of the thoracic spine to rule out spinal involvement which was  unremarkable.  Family remained concerned with possible kidney stone as  the patient in the next 24 hours following hospitalization developed  urinary retention made worse by his anxiety and narcotics.  A call was  made to Dr. Logan Bores who saw the patient later that evening and agreed  there was no apparent evidence for kidney stone and ordered a CT of the  abdomen and pelvis with contrast to rule out other intra-abdominal  pathology.  This was negative.  A renal ultrasound also was performed  that showed no obstruction or other abnormality.  It was felt this was  most likely musculoskeletal in nature and planned for a bedside flexible  cystogram which was performed December 11, and had a negative GU  evaluation.  It was felt dysfunctional voiding was due to his narcotics  and anxiety and thus, the family requested continued questionable of  possible musculoskeletal causes of pain.  In his interim, he had been  moved to step-down unit for IV Dilaudid PCA due to his uncontrolled  symptoms of pain with these spasm attacks on the floor.  After urology  evaluation, he was felt stable to return to the floor for continued pain  management having had a negative evaluation and the assistance of  palliative care medicine on behalf of Lorinda Creed, NP was enrolled.  Orthopedics was consulted December 12, to look for possible other  musculoskeletal causes of his symptoms.  They ordered a MRI of the L-  spine as well as CPK, aldolase and LDH by Dr. Madelon Lips and these were  also negative.  Neurology was consulted, but felt there was no evidence  for neurologic causes of pain given his negative evaluation and general  surgery also saw  the patient on December 12, by Junious Silk, NP and  Dr. Jamey Ripa, who found no surgical cause for pain.  The patient was  treated symptomatically with decreasing doses of IV Dilaudid and the  addition of Lidoderm patch.  He was treated continuously with anti-  inflammatory in the form of oral Toradol as well as Robaxin and/or  Valium for muscle relaxant.  There was no  evidence of herpetic shingles  and eventually the number and intensity of these spasms decreased over  the weekend.  At this time, having had a negative evaluation by multiple  subspecialties, negative imaging test and normal lab evaluation with the  resolution and control of his symptoms on current medical management,  the patient is stable for discharge home.  He will be seen by physical  therapy prior to discharge to ensure no triggering recurrence with  activity.  We will recommend for an outpatient PT evaluation and treat  which family will arrange with former orthopedic Kalyan Barabas at [facility].  They have been given a prescription for outpatient PT as well  as prescriptions for the above-listed medical management.  Close  outpatient followup with primary MD, Dr. Roxy Manns, has been arranged  for the next 48 hours to review the patient's symptoms and outpatient  management.  She will also of her scheduled time to return to work  depending on his symptoms and course post discharge.  I have also had a  long discussion with the patient and his mother regarding the thorough  negative evaluation here with consideration of outpatient evaluation at  tertiary center should there be recurrence or still unanswered questions  following discharge.   Greater than 30 minutes spent on time of discharge planning, review of  information and family/patient discussion.      Valerie A. Felicity Coyer, MD  Electronically Signed     VAL/MEDQ  D:  10/18/2007  T:  10/19/2007  Job:  [id number]
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
## 2011-03-18 NOTE — Assessment & Plan Note (Signed)
[facility]                                 ON-CALL NOTE   [name], [name]                       MRN:          [id number]  DATE:10/13/2007                            DOB:          [birth year]    TIME OF CALL:  8:11 p.m.   PHONE NUMBER:  [phone]   CALLER:  [name], his father.   CHIEF COMPLAINT:  Questions in the hospital.  The patient's father is  calling from [facility].  He is my regular patient.  He said that he has  been in [facility] since Monday.  They recently moved him to  critical care room 7306.  They have questions about his care.  He has  had abdominal pain since admission and they have not figured out what it  is yet.  Possibly a kidney stone.  They were not sure.  They need an  update from a doctor tonight and wanted someone to give them a call.  I  called the answering service and had them direct the page to Dr. Drue Novel,  who is on call for the hospital for hospital coverage tonight to call  them back.     Marne A. Tower, MD  Electronically Signed    MAT/MedQ  DD: 10/13/2007  DT: 10/14/2007  Job #: [id number]

## 2011-08-11 LAB — CBC
HCT: 38.5 — ABNORMAL LOW
HCT: 39
HCT: 40.1
Hemoglobin: 13.3
Hemoglobin: 13.5
Hemoglobin: 13.6
MCHC: 33.4
MCHC: 33.8
MCHC: 34.6
MCV: 86.5
MCV: 87
MCV: 88.2
Platelets: 251
Platelets: 257
RBC: 4.55
RBC: 5.1
RDW: 12.1
RDW: 12.6
WBC: 10
WBC: 10.5
WBC: 6

## 2011-08-11 LAB — ACETAMINOPHEN LEVEL: Acetaminophen (Tylenol), Serum: 12.3

## 2011-08-11 LAB — ALDOLASE: Aldolase: 8.5 U/L — ABNORMAL HIGH (ref <=8.1)

## 2011-08-11 LAB — URINALYSIS, ROUTINE W REFLEX MICROSCOPIC
Bilirubin Urine: NEGATIVE
Glucose, UA: NEGATIVE
Glucose, UA: NEGATIVE
Glucose, UA: NEGATIVE
Hgb urine dipstick: NEGATIVE
Hgb urine dipstick: NEGATIVE
Ketones, ur: NEGATIVE
Ketones, ur: NEGATIVE
Ketones, ur: NEGATIVE
Nitrite: NEGATIVE
Nitrite: NEGATIVE
Protein, ur: NEGATIVE
Protein, ur: NEGATIVE
Specific Gravity, Urine: >1.046 — ABNORMAL HIGH
Urobilinogen, UA: 0.2
Urobilinogen, UA: 1
pH: 5.5
pH: 7

## 2011-08-11 LAB — COMPREHENSIVE METABOLIC PANEL
ALT: 29
ALT: 29
AST: 34
Alkaline Phosphatase: 51
Alkaline Phosphatase: 58
BUN: 12
BUN: 5 — ABNORMAL LOW
CO2: 23
CO2: 23
Calcium: 9.1
Chloride: 107
Chloride: 108
Chloride: 110
Creatinine, Ser: 1.03
GFR calc Af Amer: >60
GFR calc non Af Amer: >60
GFR calc non Af Amer: >60
Glucose, Bld: 100 — ABNORMAL HIGH
Glucose, Bld: 101 — ABNORMAL HIGH
Potassium: 3.4 — ABNORMAL LOW
Potassium: 3.5
Potassium: 4.1
Sodium: 137
Sodium: 139
Total Bilirubin: 1
Total Bilirubin: 1.8 — ABNORMAL HIGH

## 2011-08-11 LAB — BASIC METABOLIC PANEL WITH GFR
BUN: 5 — ABNORMAL LOW
CO2: 25
Calcium: 9.1
Chloride: 105
Creatinine, Ser: 1.18
GFR calc non Af Amer: >60
Glucose, Bld: 87
Potassium: 3.5
Sodium: 139

## 2011-08-11 LAB — BLOOD GAS, ARTERIAL
Acid-Base Excess: 0.6
Bicarbonate: 25 — ABNORMAL HIGH
FIO2: 0.21
O2 Saturation: 93.4
Patient temperature: 98.6
pO2, Arterial: 71.8 — ABNORMAL LOW

## 2011-08-11 LAB — COMPREHENSIVE METABOLIC PANEL WITH GFR
ALT: 25
AST: 23
Albumin: 3.1 — ABNORMAL LOW
Alkaline Phosphatase: 50
BUN: 3 — ABNORMAL LOW
CO2: 30
Calcium: 9.3
Chloride: 102
Creatinine, Ser: 1.23
GFR calc non Af Amer: >60
Glucose, Bld: 123 — ABNORMAL HIGH
Potassium: 3.5
Sodium: 139
Total Bilirubin: 1.5 — ABNORMAL HIGH
Total Protein: 6.3

## 2011-08-11 LAB — TROPONIN I

## 2011-08-11 LAB — D-DIMER, QUANTITATIVE
D-Dimer, Quant: 0.25
D-Dimer, Quant: <0.22

## 2011-08-11 LAB — SEDIMENTATION RATE: Sed Rate: 18 — ABNORMAL HIGH

## 2011-08-11 LAB — DIFFERENTIAL
Basophils Absolute: 0
Basophils Relative: 0
Eosinophils Absolute: 0.3
Eosinophils Relative: 3
Lymphs Abs: 3
Monocytes Relative: 8
Neutro Abs: 6.6
Neutrophils Relative %: 67

## 2011-08-11 LAB — CK TOTAL AND CKMB (NOT AT ARMC)
CK, MB: 0.8
Relative Index: INVALID
Relative Index: INVALID

## 2011-08-11 LAB — URINE CULTURE

## 2011-08-11 LAB — AMYLASE: Amylase: 51

## 2011-08-11 LAB — MAGNESIUM: Magnesium: 2.1

## 2011-08-11 LAB — LACTATE DEHYDROGENASE: LDH: 167

## 2012-03-22 ENCOUNTER — Encounter (HOSPITAL_COMMUNITY): Payer: Self-pay | Admitting: Emergency Medicine

## 2012-03-22 ENCOUNTER — Emergency Department (INDEPENDENT_AMBULATORY_CARE_PROVIDER_SITE_OTHER)
Admission: EM | Admit: 2012-03-22 | Discharge: 2012-03-22 | Disposition: A | Discharge status: Home / Self Care | Payer: 59 | Source: Home / Self Care | Attending: Emergency Medicine | Admitting: Emergency Medicine

## 2012-03-22 DIAGNOSIS — J069 Acute upper respiratory infection, unspecified: Secondary | ICD-10-CM

## 2012-03-22 MED ORDER — FLUTICASONE PROPIONATE 50 MCG/ACT NA SUSP: 2.0000 | Freq: Every day | NASAL | Status: DC

## 2012-03-22 MED ORDER — HYDROCODONE-ACETAMINOPHEN 7.5-500 MG/15ML PO SOLN: 5.0000 mL | Freq: Four times a day (QID) | ORAL | Status: AC | PRN

## 2012-03-22 NOTE — Discharge Instructions (Signed)
Take the medication as written. Return if you get worse, have a fever >100.4, or for any concerns. You may take 600 mg of motrin with 1 gram of tylenol up to 4 times a day as needed for pain. This is an effective combination for pain. Use a neti pot or the NeilMed sinus rinse as often as you want to to reduce nasal congestion. Follow the directions on the box.  ° °Go to www.goodrx.com to look up your medications. This will give you a list of where you can find your prescriptions at the most affordable prices.  °

## 2012-03-22 NOTE — ED Provider Notes (Signed)
History     CSN: [id number]  Arrival date & time 03/22/12  8657   First MD Initiated Contact with Patient 03/22/12 [phone]      Chief Complaint  Patient presents with  . URI    (Consider location/radiation/quality/duration/timing/severity/associated sxs/prior treatment) HPI Comments: Patient reports nasal congestion, "greenish" nasal drainage, nonproductive cough, postnasal drip, difficulty sleeping at night secondary to coughing, fatigue starting yesterday. No fevers, headache, nausea, vomiting, earache. Some sore throat from postnasal drip, but no voice changes, neck pain, sensation of airway swelling shut. No chest pain, wheezing, shortness of breath, abdominal pain. He has tried over-the-counter cold medication without relief. No aggravating factors. 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  ROS as noted in HPI. All other ROS negative.   Patient is a 26 y.o. male presenting with URI. No language interpreter was used.  URI The primary symptoms include sore throat. The current episode started yesterday. This is a new problem.  Symptoms associated with the illness include chills, facial pain, sinus pressure, congestion and rhinorrhea. The illness is not associated with plugged ear sensation.    History reviewed. No pertinent past medical history.  Past Surgical History  Procedure Date  . Tympanostomy tube placement   . Hernia repair   . Ankle surgery     History reviewed. No pertinent family history.  History  Substance Use Topics  . Smoking status: Never Smoker   . Smokeless tobacco: Not on file  . Alcohol Use: Yes     occasional      Review of Systems  Constitutional: Positive for chills.  HENT: Positive for congestion, sore throat, rhinorrhea and sinus pressure.     Allergies  Mucinex and Sulfonamide derivatives  Home Medications   Current Outpatient Rx  Name Route Sig Dispense Refill  . FLUTICASONE PROPIONATE 50 MCG/ACT NA SUSP  Nasal Place 2 sprays into the nose daily. 16 g 0  . HYDROCODONE-ACETAMINOPHEN 7.5-500 MG/15ML PO SOLN Oral Take 5 mLs by mouth every 6 (six) hours as needed for pain. 120 mL 0    BP 122/80  Pulse 93  Temp(Src) 98.5 F (36.9 C) (Oral)  Resp 18  SpO2 97%  Physical Exam  Nursing note and vitals reviewed. Constitutional: He is oriented to person, place, and time. He appears well-developed and well-nourished.  HENT:  Head: Normocephalic and atraumatic.  Right Ear: Hearing, tympanic membrane and ear canal normal.  Left Ear: Hearing, tympanic membrane and ear canal normal.  Nose: Mucosal edema and rhinorrhea present. No epistaxis.  Mouth/Throat: Uvula is midline and mucous membranes are normal. Posterior oropharyngeal erythema present. No oropharyngeal exudate.       No sinus tenderness  Eyes: Conjunctivae and EOM are normal. Pupils are equal, round, and reactive to light.  Neck: Normal range of motion. Neck supple.  Cardiovascular: Normal rate, regular rhythm and normal heart sounds.   Pulmonary/Chest: Effort normal and breath sounds normal. No respiratory distress.  Abdominal: Soft. Bowel sounds are normal.  Musculoskeletal: Normal range of motion.  Lymphadenopathy:    He has no cervical adenopathy.  Neurological: He is alert and oriented to person, place, and time.  Skin: Skin is warm and dry. No rash noted.  Psychiatric: He has a normal mood and affect. His behavior is normal. Judgment and thought content normal.    ED Course  Procedures (including critical care time)  Labs Reviewed - No data to display No results found.   1. URI (upper respiratory infection)  MDM    Luiz Blare, MD 03/22/12 [phone]

## 2012-03-22 NOTE — ED Notes (Signed)
C/o of cold type symptoms since yesterday

## 2013-02-06 ENCOUNTER — Ambulatory Visit (INDEPENDENT_AMBULATORY_CARE_PROVIDER_SITE_OTHER): Payer: 59 | Admitting: Family Medicine

## 2013-02-06 VITALS — BP 127/88 | HR 75 | Temp 99.0°F | Resp 17 | Ht 73.0 in | Wt 183.0 lb

## 2013-02-06 DIAGNOSIS — M79671 Pain in right foot: Secondary | ICD-10-CM

## 2013-02-06 DIAGNOSIS — M109 Gout, unspecified: Secondary | ICD-10-CM

## 2013-02-06 DIAGNOSIS — M79609 Pain in unspecified limb: Secondary | ICD-10-CM

## 2013-02-06 LAB — COMPREHENSIVE METABOLIC PANEL
AST: 22 U/L (ref 0–37)
BUN: 14 mg/dL (ref 6–23)
Calcium: 9.5 mg/dL (ref 8.4–10.5)
Chloride: 103 mEq/L (ref 96–112)
Creat: 1.11 mg/dL (ref 0.50–1.35)
Total Bilirubin: 1.9 mg/dL — ABNORMAL HIGH (ref 0.3–1.2)

## 2013-02-06 LAB — URIC ACID: Uric Acid, Serum: 9.8 mg/dL — ABNORMAL HIGH (ref 4.0–7.8)

## 2013-02-06 MED ORDER — COLCHICINE 0.6 MG PO TABS: ORAL_TABLET | ORAL | Status: DC

## 2013-02-06 MED ORDER — INDOMETHACIN 50 MG PO CAPS: 50.0000 mg | ORAL_CAPSULE | Freq: Two times a day (BID) | ORAL | Status: DC

## 2013-02-06 NOTE — Patient Instructions (Addendum)
Gout Gout is an inflammatory condition (arthritis) caused by a buildup of uric acid crystals in the joints. Uric acid is a chemical that is normally present in the blood. Under some circumstances, uric acid can form into crystals in your joints. This causes joint redness, soreness, and swelling (inflammation). Repeat attacks are common. Over time, uric acid crystals can form into masses (tophi) near a joint, causing disfigurement. Gout is treatable and often preventable. CAUSES  The disease begins with elevated levels of uric acid in the blood. Uric acid is produced by your body when it breaks down a naturally found substance called purines. This also happens when you eat certain foods such as meats and fish. Causes of an elevated uric acid level include:  Being passed down from parent to child (heredity).  Diseases that cause increased uric acid production (obesity, psoriasis, some cancers).  Excessive alcohol use.  Diet, especially diets rich in meat and seafood.  Medicines, including certain cancer-fighting drugs (chemotherapy), diuretics, and aspirin.  Chronic kidney disease. The kidneys are no longer able to remove uric acid well.  Problems with metabolism. Conditions strongly associated with gout include:  Obesity.  High blood pressure.  High cholesterol.  Diabetes. Not everyone with elevated uric acid levels gets gout. It is not understood why some people get gout and others do not. Surgery, joint injury, and eating too much of certain foods are some of the factors that can lead to gout. SYMPTOMS   An attack of gout comes on quickly. It causes intense pain with redness, swelling, and warmth in a joint.  Fever can occur.  Often, only one joint is involved. Certain joints are more commonly involved:  Base of the big toe.  Knee.  Ankle.  Wrist.  Finger. Without treatment, an attack usually goes away in a few days to weeks. Between attacks, you usually will not have  symptoms, which is different from many other forms of arthritis. DIAGNOSIS  Your caregiver will suspect gout based on your symptoms and exam. Removal of fluid from the joint (arthrocentesis) is done to check for uric acid crystals. Your caregiver will give you a medicine that numbs the area (local anesthetic) and use a needle to remove joint fluid for exam. Gout is confirmed when uric acid crystals are seen in joint fluid, using a special microscope. Sometimes, blood, urine, and X-ray tests are also used. TREATMENT  There are 2 phases to gout treatment: treating the sudden onset (acute) attack and preventing attacks (prophylaxis). Treatment of an Acute Attack  Medicines are used. These include anti-inflammatory medicines or steroid medicines.  An injection of steroid medicine into the affected joint is sometimes necessary.  The painful joint is rested. Movement can worsen the arthritis.  You may use warm or cold treatments on painful joints, depending which works best for you.  Discuss the use of coffee, vitamin C, or cherries with your caregiver. These may be helpful treatment options. Treatment to Prevent Attacks After the acute attack subsides, your caregiver may advise prophylactic medicine. These medicines either help your kidneys eliminate uric acid from your body or decrease your uric acid production. You may need to stay on these medicines for a very long time. The early phase of treatment with prophylactic medicine can be associated with an increase in acute gout attacks. For this reason, during the first few months of treatment, your caregiver may also advise you to take medicines usually used for acute gout treatment. Be sure you understand your caregiver's directions.   You should also discuss dietary treatment with your caregiver. Certain foods such as meats and fish can increase uric acid levels. Other foods such as dairy can decrease levels. Your caregiver can give you a list of foods  to avoid. HOME CARE INSTRUCTIONS   Do not take aspirin to relieve pain. This raises uric acid levels.  Only take over-the-counter or prescription medicines for pain, discomfort, or fever as directed by your caregiver.  Rest the joint as much as possible. When in bed, keep sheets and blankets off painful areas.  Keep the affected joint raised (elevated).  Use crutches if the painful joint is in your leg.  Drink enough water and fluids to keep your urine clear or pale yellow. This helps your body get rid of uric acid. Do not drink alcoholic beverages. They slow the passage of uric acid.  Follow your caregiver's dietary instructions. Pay careful attention to the amount of protein you eat. Your daily diet should emphasize fruits, vegetables, whole grains, and fat-free or low-fat milk products.  Maintain a healthy body weight. SEEK MEDICAL CARE IF:   You have an oral temperature above 102 F (38.9 C).  You develop diarrhea, vomiting, or any side effects from medicines.  You do not feel better in 24 hours, or you are getting worse. SEEK IMMEDIATE MEDICAL CARE IF:   Your joint becomes suddenly more tender and you have:  Chills.  An oral temperature above 102 F (38.9 C), not controlled by medicine. MAKE SURE YOU:   Understand these instructions.  Will watch your condition.  Will get help right away if you are not doing well or get worse. Document Released: 10/17/2000 Document Revised: 01/12/2012 Document Reviewed: 01/28/2010 [facility] Patient Information 2013 [place], [place].    Take the colchicine one tablet initially, 1 after about 2 hours, then continue on with 1 pill 3 times daily. It can cause loose stools, and you will need to reduce the dose if you diarrhea  Take Indocin (indomethacin) 1 pill 3 times daily with food. It can be stopped after you are doing well or for 5 days.

## 2013-02-06 NOTE — Progress Notes (Signed)
Subjective: 27 year old man with a right large toe and the surrounding area pain. He was active yesterday but it was later in the day riding home that he started noticing a lot of pain in his foot. It got progressively worse all evening. This morning he can hardly move his foot get out of bed. He has pain in the first joint of his large toe and into the second toe some. It started looking red. Knows of no specific injury. He took some Tylenol last night.  He is generally healthy. He does take Adderall for ADHD.  Objective:  well-developed well-nourished white male in no major acute distress. Mental been bothering him is the foot. He is a smoker to the office today. His foot has a little bit of swelling over the right first MTP joint. The movement of the third to fifth toe does not hurt. The second toe hurts some. The first toe is exquisitely painful. He is very painful in the MTP joint.  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 Will decide later if he needs long-term treatment. This is his first attack.

## 2013-02-08 ENCOUNTER — Other Ambulatory Visit: Payer: Self-pay | Admitting: Family Medicine

## 2013-02-08 MED ORDER — ALLOPURINOL 100 MG PO TABS: 100.0000 mg | ORAL_TABLET | Freq: Every day | ORAL | Status: DC

## 2013-02-18 ENCOUNTER — Ambulatory Visit (INDEPENDENT_AMBULATORY_CARE_PROVIDER_SITE_OTHER): Payer: 59 | Admitting: Physician Assistant

## 2013-02-18 VITALS — BP 126/80 | HR 106 | Temp 100.4°F | Resp 16 | Ht 73.0 in | Wt 185.0 lb

## 2013-02-18 DIAGNOSIS — J069 Acute upper respiratory infection, unspecified: Secondary | ICD-10-CM

## 2013-02-18 MED ORDER — BENZONATATE 100 MG PO CAPS: ORAL_CAPSULE | ORAL | Status: AC

## 2013-02-18 MED ORDER — HYDROCOD POLST-CHLORPHEN POLST 10-8 MG/5ML PO LQCR: 5.0000 mL | Freq: Two times a day (BID) | ORAL | Status: AC

## 2013-02-18 MED ORDER — IPRATROPIUM BROMIDE 0.06 % NA SOLN
2.0000 | Freq: Three times a day (TID) | NASAL | Status: DC
Start: 2013-02-18 — End: 2020-12-25

## 2013-02-18 NOTE — Progress Notes (Signed)
   [address] [place] [address]   Phone [phone]  Subjective:    Patient ID: [name], male    DOB: 1986-11-03, 27 y.o.   MRN: [id number]  HPI  Sick for 2 days.  Started with a sore throat that has partially improved but now he hurts all over and has a terrible cough that is keeping him up all night. He is congested with PND and a cough. He is using OTC cold preps but they are not helping much.  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    Review of Systems  Constitutional: Positive for chills and fatigue. Negative for fever.  HENT: Positive for congestion, sore throat (now resolved), rhinorrhea (green) and postnasal drip.   Respiratory: Positive for cough (green thick). Negative for shortness of breath and wheezing.   Gastrointestinal: Negative for nausea, vomiting and diarrhea.  Musculoskeletal: Positive for myalgias.  Neurological: Positive for headaches.  Psychiatric/Behavioral: Positive for sleep disturbance (cough keeping him up).       Objective:   Physical Exam  Vitals reviewed. Constitutional: He is oriented to person, place, and time. He appears well-developed and well-nourished.  HENT:  Head: Normocephalic and atraumatic.  Right Ear: Hearing, tympanic membrane, external ear and ear canal normal.  Left Ear: Hearing, tympanic membrane, external ear and ear canal normal.  Nose: Mucosal edema (erythematous) present.  Mouth/Throat: Uvula is midline, oropharynx is clear and moist and mucous membranes are normal.  Eyes: Conjunctivae are normal.  Cardiovascular: Normal rate, regular rhythm and normal heart sounds.   Pulmonary/Chest: Effort normal and breath sounds normal.  Pt coughing almost the entire exam.  Neurological: He is alert and oriented to person, place, and time.  Skin: Skin is warm and dry.  Psychiatric: He has a normal mood and affect. His behavior is normal. Judgment and thought content normal.        Assessment & Plan:  Viral URI with cough - Plan: chlorpheniramine-HYDROcodone (TUSSIONEX PENNKINETIC ER) 10-8 MG/5ML LQCR, ipratropium (ATROVENT) 0.06 % nasal spray, benzonatate (TESSALON) 100 MG capsule -- Pt should not use the cough syrup while at work.  He will pick up the tessalon perles if he is still having trouble with the tickle in his throat that is starting the cough but I think that once he gets a good night sleep.  He will rest and push fluids.  Tylenol and Motrin for fever and body aches.     Benny Lennert PA-C 02/18/2013 2:09 PM

## 2015-09-25 ENCOUNTER — Other Ambulatory Visit: Payer: Self-pay | Admitting: Nurse Practitioner

## 2015-09-25 ENCOUNTER — Ambulatory Visit
Admission: RE | Admit: 2015-09-25 | Discharge: 2015-09-25 | Disposition: A | Discharge status: Home / Self Care | Payer: 59 | Source: Ambulatory Visit | Attending: Nurse Practitioner | Admitting: Nurse Practitioner

## 2015-09-25 DIAGNOSIS — T1490XA Injury, unspecified, initial encounter: Secondary | ICD-10-CM

## 2015-11-27 ENCOUNTER — Ambulatory Visit: Payer: 59 | Admitting: Primary Care

## 2018-09-21 ENCOUNTER — Ambulatory Visit (INDEPENDENT_AMBULATORY_CARE_PROVIDER_SITE_OTHER): Payer: 59

## 2018-09-21 ENCOUNTER — Other Ambulatory Visit: Payer: Self-pay

## 2018-09-21 ENCOUNTER — Encounter (HOSPITAL_COMMUNITY): Payer: Self-pay

## 2018-09-21 ENCOUNTER — Ambulatory Visit (HOSPITAL_COMMUNITY)
Admission: EM | Admit: 2018-09-21 | Discharge: 2018-09-21 | Disposition: A | Discharge status: Home / Self Care | Payer: 59 | Attending: Family Medicine | Admitting: Family Medicine

## 2018-09-21 DIAGNOSIS — R0789 Other chest pain: Secondary | ICD-10-CM

## 2018-09-21 DIAGNOSIS — I951 Orthostatic hypotension: Secondary | ICD-10-CM

## 2018-09-21 MED ORDER — POVIDONE-IODINE 10 % EX SOLN
CUTANEOUS | Status: AC
Start: 2018-09-21 — End: 2018-09-21
  Filled 2018-09-21: qty 118

## 2018-09-21 MED ORDER — MELOXICAM 7.5 MG PO TABS: 7.5000 mg | ORAL_TABLET | Freq: Every day | ORAL | 0 refills | Status: DC

## 2018-09-21 MED ORDER — KETOROLAC TROMETHAMINE 30 MG/ML IJ SOLN
INTRAMUSCULAR | Status: AC
  Filled 2018-09-21: qty 1

## 2018-09-21 MED ORDER — KETOROLAC TROMETHAMINE 30 MG/ML IJ SOLN
30.0000 mg | Freq: Once | INTRAMUSCULAR | Status: AC
  Administered 2018-09-21: 30 mg via INTRAMUSCULAR

## 2018-09-21 NOTE — ED Provider Notes (Signed)
[facility]    CSN: [id number] Arrival date & time: 09/21/18  1754     History   Chief Complaint Chief Complaint  Patient presents with  . Shortness of Breath    HPI [name] is a 32 y.o. male.   32 year old male comes in for 2 day history of right sided chest pain. Constant, sharp/heavy pain, 4-5/10, breathing deep/bending over increases the pain. States breathing more shallow due to the pain, but not short of breath. Feels slightly lightheaded, but denies syncope, dizziness. Denies fever, chills, night sweats. Denies URI symptoms such as cough, congestion, sore throat. Denies strenuous activity.  Never smoker, does use smokeless tobacco. No long travels. No personal history of blood clots, heart disease. Denies illicit drug use. Father with history of DVT/PE.     History reviewed. No pertinent past medical history.  Patient Active Problem List   Diagnosis Date Noted  . BACK PAIN 07/25/2009  . MIGRAINES, HX OF 10/19/2007    Past Surgical History:  Procedure Laterality Date  . ANKLE SURGERY    . HERNIA REPAIR    . TYMPANOSTOMY TUBE PLACEMENT         Home Medications    Prior to Admission medications   Medication Sig Start Date End Date Taking? Authorizing Provider  allopurinol (ZYLOPRIM) 100 MG tablet Take 1 tablet (100 mg total) by mouth daily. 02/08/13   Peyton Najjar, MD  amphetamine-dextroamphetamine (ADDERALL XR) 20 MG 24 hr capsule Take 20 mg by mouth daily.    [provider]  colchicine 0.6 MG tablet Take one now, one in 2 hours , then continue with 3 times daily until pain is much better for a couple of days. 02/06/13   Peyton Najjar, MD  indomethacin (INDOCIN) 50 MG capsule Take 1 capsule (50 mg total) by mouth 2 (two) times daily with a meal. 02/06/13   Peyton Najjar, MD  ipratropium (ATROVENT) 0.06 % nasal spray Place 2 sprays into the nose 3 (three) times daily. 02/18/13   Weber, Dema Severin, PA-C  meloxicam (MOBIC) 7.5 MG tablet  Take 1 tablet (7.5 mg total) by mouth daily. 09/21/18   Belinda Fisher, PA-C    Family History History reviewed. No pertinent family history.  Social History Social History   Tobacco Use  . Smoking status: Never Smoker  . Smokeless tobacco: Never Used  Substance Use Topics  . Alcohol use: Yes    Comment: occasional  . Drug use: No     Allergies   Mucinex [guaifenesin er] and Sulfonamide derivatives   Review of Systems Review of Systems  Reason unable to perform ROS: See HPI as above.     Physical Exam Triage Vital Signs ED Triage Vitals  Enc Vitals Group     BP 09/21/18 1827 (!) 107/57     Pulse Rate 09/21/18 1827 64     Resp 09/21/18 1827 18     Temp 09/21/18 1827 98.2 F (36.8 C)     Temp Source 09/21/18 1827 Oral     SpO2 09/21/18 1827 100 %     Weight 09/21/18 1823 190 lb (86.2 kg)     Height --      Head Circumference --      Peak Flow --      Pain Score 09/21/18 1828 4     Pain Loc --      Pain Edu? --      Excl. in GC? --  Orthostatic VS for the past 24 hrs:  BP- Lying Pulse- Lying BP- Sitting Pulse- Sitting BP- Standing at 0 minutes Pulse- Standing at 0 minutes  09/21/18 1951 108/64 68 116/67 82 116/68 94    Updated Vital Signs BP (!) 107/57 (BP Location: Right Arm)   Pulse 64   Temp 98.2 F (36.8 C) (Oral)   Resp 18   Wt 190 lb (86.2 kg)   SpO2 100%   BMI 25.07 kg/m   Physical Exam  Constitutional: He is oriented to person, place, and time. He appears well-developed and well-nourished.  Non-toxic appearance. He does not appear ill. No distress.  Speaking in full sentences without difficulty.  HENT:  Head: Normocephalic and atraumatic.  Mouth/Throat: Oropharynx is clear and moist.  Eyes: Pupils are equal, round, and reactive to light. Conjunctivae are normal.  Neck: Normal range of motion. Neck supple.  Cardiovascular: Normal rate, regular rhythm and normal heart sounds. Exam reveals no gallop and no friction rub.  No murmur  heard. Pulmonary/Chest: No accessory muscle usage. No respiratory distress.  Abrupt stop with inspiration due to pain. No adventitious lung sounds.    No rashes, erythema, warmth, contusion, swelling to the chest. Tenderness to palpation of right chest.   Musculoskeletal:       Right lower leg: Normal. He exhibits no edema.       Left lower leg: Normal. He exhibits no edema.  Neurological: He is alert and oriented to person, place, and time.  Skin: Skin is warm and dry. He is not diaphoretic.     UC Treatments / Results  Labs (all labs ordered are listed, but only abnormal results are displayed) Labs Reviewed - No data to display  EKG None  Radiology Dg Chest 2 View  Result Date: 09/21/2018 CLINICAL DATA:  Chest pain. EXAM: CHEST - 2 VIEW COMPARISON:  July 09, 2009 FINDINGS: The heart size and mediastinal contours are within normal limits. Both lungs are clear. The visualized skeletal structures are unremarkable. IMPRESSION: No active cardiopulmonary disease. Electronically Signed   By: Gerome Samavid  Williams III M.D   On: 09/21/2018 19:28    Procedures Procedures (including critical care time)  Medications Ordered in UC Medications  ketorolac (TORADOL) 30 MG/ML injection 30 mg (30 mg Intramuscular Given 09/21/18 1957)    Initial Impression / Assessment and Plan / UC Course  I have reviewed the triage vital signs and the nursing notes.  Pertinent labs & imaging results that were available during my care of the patient were reviewed by me and considered in my medical decision making (see chart for details).    Patient became diaphoretic and feeling "hot" when walking to CXR, denies worsening chest pain, shortness of breath, palpitations. He was no longer diaphoretic when I went into room, RRR, CTBA. Will obtain orthostatics after CXR.  Patient with positive orthostatics, will have patient push fluids. His diaphoresis, feeling "hot" symptoms have resolved. CXR without acute  abnormalities. EKG NSR, 85bpm, RAD, nonspecific ST changes, no prior EKG to compare. EKG also reviewed by Dr Tracie HarrierHagler. Reproducible chest pain that suggest MSK causes of symptoms. Will provide toradol injection. Mobic as directed. Offered muscle relaxant, patient would like to avoid. Return precautions given.  Final Clinical Impressions(s) / UC Diagnoses   Final diagnoses:  Chest wall pain  Atypical chest pain    ED Prescriptions    Medication Sig Dispense Auth. Provider   meloxicam (MOBIC) 7.5 MG tablet Take 1 tablet (7.5 mg total) by mouth daily. 15  tablet Threasa Alpha, [place] 09/21/18 2034

## 2018-09-21 NOTE — Discharge Instructions (Signed)
No alarming sign on exam. Your EKG and chest xray were normal. Your heart rate increased when with the new set of vital, although your blood pressure was stable, this can indicate some dehydration. Keep hydrated, your urine should be clear to pale yellow in color. Given your pain was reproducible by pressure, more likely musculoskeletal related. Toradol injection in office today. Start Mobic. Do not take ibuprofen (motrin/advil)/ naproxen (aleve) while on mobic. Monitor for any worsening or changes in symptoms, follow up with PCP for further evaluation. If you have sudden shortness of breath, worsening of chest pain, go to the emergency department for further evaluation needed.

## 2018-09-21 NOTE — ED Triage Notes (Signed)
Pt cc he has a sob  From time to time. Pt states he has chest pain. X 2 days

## 2020-12-17 ENCOUNTER — Emergency Department (HOSPITAL_COMMUNITY)
Admission: EM | Admit: 2020-12-17 | Discharge: 2020-12-17 | Disposition: A | Discharge status: Home / Self Care | Payer: 59 | Attending: Emergency Medicine | Admitting: Emergency Medicine

## 2020-12-17 ENCOUNTER — Encounter (HOSPITAL_COMMUNITY): Payer: Self-pay | Admitting: *Deleted

## 2020-12-17 DIAGNOSIS — Z5321 Procedure and treatment not carried out due to patient leaving prior to being seen by health care provider: Secondary | ICD-10-CM | POA: Diagnosis not present

## 2020-12-17 DIAGNOSIS — R002 Palpitations: Secondary | ICD-10-CM | POA: Diagnosis present

## 2020-12-17 LAB — BASIC METABOLIC PANEL
Anion gap: 10 (ref 5–15)
BUN: 12 mg/dL (ref 6–20)
CO2: 27 mmol/L (ref 22–32)
Calcium: 9.4 mg/dL (ref 8.9–10.3)
Chloride: 102 mmol/L (ref 98–111)
Creatinine, Ser: 1.27 mg/dL — ABNORMAL HIGH (ref 0.61–1.24)
GFR, Estimated: >60 mL/min (ref >60)
Glucose, Bld: 98 mg/dL (ref 70–99)
Potassium: 3.8 mmol/L (ref 3.5–5.1)
Sodium: 139 mmol/L (ref 135–145)

## 2020-12-17 LAB — CBC
HCT: 42.7 % (ref 39.0–52.0)
Hemoglobin: 14.7 g/dL (ref 13.0–17.0)
MCH: 31.2 pg (ref 26.0–34.0)
MCHC: 34.4 g/dL (ref 30.0–36.0)
MCV: 90.7 fL (ref 80.0–100.0)
Platelets: 259 10*3/uL (ref 150–400)
RBC: 4.71 MIL/uL (ref 4.22–5.81)
RDW: 12.4 % (ref 11.5–15.5)
WBC: 6.3 10*3/uL (ref 4.0–10.5)
nRBC: 0 % (ref 0.0–0.2)

## 2020-12-17 LAB — TROPONIN I (HIGH SENSITIVITY)
Troponin I (High Sensitivity): 3 ng/L (ref <18)
Troponin I (High Sensitivity): <2 ng/L (ref <18)

## 2020-12-17 NOTE — ED Notes (Signed)
LWBS 

## 2020-12-17 NOTE — ED Triage Notes (Signed)
Pt reports having episode of feeling like his heart was racing today. HR is 84 at triage. Denies caffeine use, no cp reported.

## 2020-12-25 ENCOUNTER — Other Ambulatory Visit: Payer: Self-pay

## 2020-12-25 ENCOUNTER — Encounter: Payer: Self-pay | Admitting: Cardiology

## 2020-12-25 ENCOUNTER — Ambulatory Visit (INDEPENDENT_AMBULATORY_CARE_PROVIDER_SITE_OTHER): Payer: 59

## 2020-12-25 ENCOUNTER — Ambulatory Visit: Payer: 59 | Admitting: Cardiology

## 2020-12-25 VITALS — BP 120/80 | HR 69 | Ht 73.0 in | Wt 184.0 lb

## 2020-12-25 DIAGNOSIS — Z720 Tobacco use: Secondary | ICD-10-CM

## 2020-12-25 DIAGNOSIS — R002 Palpitations: Secondary | ICD-10-CM

## 2020-12-25 NOTE — Patient Instructions (Signed)
Medication Instructions:  Your physician recommends that you continue on your current medications as directed. Please refer to the Current Medication list given to you today.  *If you need a refill on your cardiac medications before your next appointment, please call your pharmacy*   Lab Work: None ordered If you have labs (blood work) drawn today and your tests are completely normal, you will receive your results only by: . MyChart Message (if you have MyChart) OR . A paper copy in the mail If you have any lab test that is abnormal or we need to change your treatment, we will call you to review the results.   Testing/Procedures:  Your physician has recommended that you wear a Zio monitor for 2 weeks.   This monitor is a medical device that records the heart's electrical activity. Doctors most often use these monitors to diagnose arrhythmias. Arrhythmias are problems with the speed or rhythm of the heartbeat. The monitor is a small device applied to your chest. You can wear one while you do your normal daily activities. While wearing this monitor if you have any symptoms to push the button and record what you felt. Once you have worn this monitor for the period of time provider prescribed (Usually 14 days), you will return the monitor device in the postage paid box. Once it is returned they will download the data collected and provide us with a report which the provider will then review and we will call you with those results. Important tips:  1. Avoid showering during the first 24 hours of wearing the monitor. 2. Avoid excessive sweating to help maximize wear time. 3. Do not submerge the device, no hot tubs, and no swimming pools. 4. Keep any lotions or oils away from the patch. 5. After 24 hours you may shower with the patch on. Take brief showers with your back facing the shower head.  6. Do not remove patch once it has been placed because that will interrupt data and decrease adhesive  wear time. 7. Push the button when you have any symptoms and write down what you were feeling. 8. Once you have completed wearing your monitor, remove and place into box which has postage paid and place in your outgoing mailbox.  9. If for some reason you have misplaced your box then call our office and we can provide another box and/or mail it off for you.      Follow-Up: At [facility], you and your health needs are our priority.  As part of our continuing mission to provide you with exceptional heart care, we have created designated Provider Care Teams.  These Care Teams include your primary Cardiologist (physician) and Advanced Practice Providers (APPs -  Physician Assistants and Nurse Practitioners) who all work together to provide you with the care you need, when you need it.  We recommend signing up for the patient portal called "MyChart".  Sign up information is provided on this After Visit Summary.  MyChart is used to connect with patients for Virtual Visits (Telemedicine).  Patients are able to view lab/test results, encounter notes, upcoming appointments, etc.  Non-urgent messages can be sent to your provider as well.   To learn more about what you can do with MyChart, go to [email or web address].    Your next appointment:   6 week(s)  The format for your next appointment:   In Person  Provider:   Brian Agbor-Etang, MD   Other Instructions   

## 2020-12-25 NOTE — Progress Notes (Signed)
Cardiology Office Note:    Date:  12/25/2020   ID:  CRECENCIO KWIATEK, DOB 09/30/1986, MRN [id number]  PCP:  Tomi Bamberger, NP (Inactive)   [facility]  Cardiologist:  Debbe Odea, MD  Advanced Practice Provider:  No care team member to display Electrophysiologist:  None       Referring MD: No ref. provider found   Chief Complaint  Patient presents with  . New Patient (Initial Visit)    ED follow up - Patient c/o palpitations that comes and goes. Meds reviewed verbally with patient.     History of Present Illness:    [name] is a 35 y.o. male with history of gout, tobacco use  who presents due to palpitations.  Patient states having on and off palpitations over the last several months which seem to have worsened 2 weeks ago.  He is a IT sales professional by profession.  He was at work, just sitting without any exertional activity for maybe an hour.  He suddenly felt his heart beating fast.  Checked his heart rate with a pulse oximeter which showed heart rates of about 108, blood pressure was in the 160s systolic.    He presented to [facility], ED where blood work was obtained including EKG, troponins which were all normal.  He left without being seen due to long wait times.  He has had about 2 episodes since then.  Although not as severe as 2 weeks ago.  He denies any history of heart disease.  He rarely drinks coffee, occasionally drinks energy drinks daily twice a week, do not drink any of that date.  Presented to the ED about 2 weeks ago due to symptoms, he left without being seen due to long wait times.  History reviewed. No pertinent past medical history.  Past Surgical History:  Procedure Laterality Date  . ANKLE SURGERY    . HERNIA REPAIR    . TYMPANOSTOMY TUBE PLACEMENT      Current Medications: Current Meds  Medication Sig  . indomethacin (INDOCIN) 50 MG capsule Take 50 mg by mouth 2 (two) times daily as needed.     Allergies:    Mucinex [guaifenesin er] and Sulfonamide derivatives   Social History   Socioeconomic History  . Marital status: Married    Spouse name: Not on file  . Number of children: Not on file  . Years of education: Not on file  . Highest education level: Not on file  Occupational History  . Not on file  Tobacco Use  . Smoking status: Never Smoker  . Smokeless tobacco: Never Used  Substance and Sexual Activity  . Alcohol use: Yes    Comment: occasional  . Drug use: No  . Sexual activity: Yes    Birth control/protection: None  Other Topics Concern  . Not on file  Social History Narrative  . Not on file   Social Determinants of Health   Financial Resource Strain: Not on file  Food Insecurity: Not on file  Transportation Needs: Not on file  Physical Activity: Not on file  Stress: Not on file  Social Connections: Not on file     Family History: The patient's family history is not on file.  ROS:   Please see the history of present illness.     All other systems reviewed and are negative.  EKGs/Labs/Other Studies Reviewed:    The following studies were reviewed today:   EKG:  EKG is  ordered today.  The ekg ordered today demonstrates normal sinus rhythm, borderline ECG.  Recent Labs: 12/17/2020: BUN 12; Creatinine, Ser 1.27; Hemoglobin 14.7; Platelets 259; Potassium 3.8; Sodium 139  Recent Lipid Panel No results found for: CHOL, TRIG, HDL, CHOLHDL, VLDL, LDLCALC, LDLDIRECT   Risk Assessment/Calculations:      Physical Exam:    VS:  BP 120/80 (BP Location: Left Arm, Patient Position: Sitting, Cuff Size: Normal)   Pulse 69   Ht 6\' 1"  (1.854 m)   Wt 184 lb (83.5 kg)   SpO2 99%   BMI 24.28 kg/m     Wt Readings from Last 3 Encounters:  12/25/20 184 lb (83.5 kg)  09/21/18 190 lb (86.2 kg)  02/18/13 185 lb (83.9 kg)     GEN:  Well nourished, well developed in no acute distress HEENT: Normal NECK: No JVD; No carotid bruits LYMPHATICS: No  lymphadenopathy CARDIAC: RRR, no murmurs, rubs, gallops RESPIRATORY:  Clear to auscultation without rales, wheezing or rhonchi  ABDOMEN: Soft, non-tender, non-distended MUSCULOSKELETAL:  No edema; No deformity  SKIN: Warm and dry NEUROLOGIC:  Alert and oriented x 3 PSYCHIATRIC:  Normal affect   ASSESSMENT:    1. Palpitations   2. Tobacco dipper    PLAN:    In order of problems listed above:  1. Palpitations, place a 2-week cardiac monitor to evaluate any significant arrhythmias.  Advised to abstain from stimulants such as energy drinks, coffee. 2. Currently chews tobacco, cessation advised.  Follow-up after cardiac monitor.     Medication Adjustments/Labs and Tests Ordered: Current medicines are reviewed at length with the patient today.  Concerns regarding medicines are outlined above.  Orders Placed This Encounter  Procedures  . LONG TERM MONITOR (3-14 DAYS)  . EKG 12-Lead   No orders of the defined types were placed in this encounter.   Patient Instructions  Medication Instructions:  Your physician recommends that you continue on your current medications as directed. Please refer to the Current Medication list given to you today.  *If you need a refill on your cardiac medications before your next appointment, please call your pharmacy*   Lab Work: None ordered If you have labs (blood work) drawn today and your tests are completely normal, you will receive your results only by: 02/20/13 MyChart Message (if you have MyChart) OR . A paper copy in the mail If you have any lab test that is abnormal or we need to change your treatment, we will call you to review the results.   Testing/Procedures:  Your physician has recommended that you wear a Zio monitor for 2 weeks. This monitor is a medical device that records the heart's electrical activity. Doctors most often use these monitors to diagnose arrhythmias. Arrhythmias are problems with the speed or rhythm of the heartbeat.  The monitor is a small device applied to your chest. You can wear one while you do your normal daily activities. While wearing this monitor if you have any symptoms to push the button and record what you felt. Once you have worn this monitor for the period of time provider prescribed (Usually 14 days), you will return the monitor device in the postage paid box. Once it is returned they will download the data collected and provide Marland Kitchen with a report which the provider will then review and we will call you with those results. Important tips:  1. Avoid showering during the first 24 hours of wearing the monitor. 2. Avoid excessive sweating to help maximize wear time. 3. Do not  submerge the device, no hot tubs, and no swimming pools. 4. Keep any lotions or oils away from the patch. 5. After 24 hours you may shower with the patch on. Take brief showers with your back facing the shower head.  6. Do not remove patch once it has been placed because that will interrupt data and decrease adhesive wear time. 7. Push the button when you have any symptoms and write down what you were feeling. 8. Once you have completed wearing your monitor, remove and place into box which has postage paid and place in your outgoing mailbox.  9. If for some reason you have misplaced your box then call our office and we can provide another box and/or mail it off for you.        Follow-Up: At [facility], you and your health needs are our priority.  As part of our continuing mission to provide you with exceptional heart care, we have created designated Provider Care Teams.  These Care Teams include your primary Cardiologist (physician) and Advanced Practice Providers (APPs -  Physician Assistants and Nurse Practitioners) who all work together to provide you with the care you need, when you need it.  We recommend signing up for the patient portal called "MyChart".  Sign up information is provided on this After Visit Summary.   MyChart is used to connect with patients for Virtual Visits (Telemedicine).  Patients are able to view lab/test results, encounter notes, upcoming appointments, etc.  Non-urgent messages can be sent to your provider as well.   To learn more about what you can do with MyChart, go to [email or web address].    Your next appointment:   6 week(s)  The format for your next appointment:   In Person  Provider:   Debbe Odea, MD   Other Instructions      Signed, Debbe Odea, MD  12/25/2020 9:22 AM    [facility]

## 2021-01-02 ENCOUNTER — Telehealth: Payer: Self-pay | Admitting: Cardiology

## 2021-01-02 NOTE — Telephone Encounter (Signed)
Spoke with patient and reviewed that he should mail monitor back in using the box provided. Advised that it may have enough data for provider to review. He did report using a smart watch to monitor heart rate and has not had any symptoms since then. Reviewed that once provider reviews monitor we will call with results. He verbalized understanding with no further questions at this time.

## 2021-01-02 NOTE — Telephone Encounter (Signed)
Patient states he had a monitor placed and after 8 days it "fell off". Please call to discuss.

## 2021-01-17 ENCOUNTER — Ambulatory Visit: Payer: 59 | Admitting: Internal Medicine

## 2021-02-08 ENCOUNTER — Encounter: Payer: Self-pay | Admitting: Cardiology

## 2021-02-08 ENCOUNTER — Other Ambulatory Visit: Payer: Self-pay

## 2021-02-08 ENCOUNTER — Ambulatory Visit: Payer: 59 | Admitting: Cardiology

## 2021-02-08 VITALS — BP 118/70 | HR 81 | Ht 73.0 in | Wt 184.0 lb

## 2021-02-08 DIAGNOSIS — R002 Palpitations: Secondary | ICD-10-CM | POA: Diagnosis not present

## 2021-02-08 DIAGNOSIS — Z720 Tobacco use: Secondary | ICD-10-CM | POA: Diagnosis not present

## 2021-02-08 NOTE — Progress Notes (Signed)
Cardiology Office Note:    Date:  02/08/2021   ID:  [name], DOB July 17, 1986, MRN [id number]  PCP:  Tomi Bamberger, NP (Inactive)   [facility]  Cardiologist:  Debbe Odea, MD  Advanced Practice Provider:  No care team member to display Electrophysiologist:  None       Referring MD: No ref. provider found   Chief Complaint  Patient presents with  . Other    Follow up post ZIO. Med reviewed verbally with patient.     History of Present Illness:    [name] is a 35 y.o. male with history of gout, tobacco use  who presents for follow-up.  He was last seen due to palpitations.  Endorses drinking energy drinks, occasional caffeine.  Overall his symptoms have improved since the last visit.  A cardiac monitor was placed which patient wore for about 6 days.  Has no other concerns at this time.   History reviewed. No pertinent past medical history.  Past Surgical History:  Procedure Laterality Date  . ANKLE SURGERY    . HERNIA REPAIR    . TYMPANOSTOMY TUBE PLACEMENT      Current Medications: Current Meds  Medication Sig  . indomethacin (INDOCIN) 50 MG capsule Take 50 mg by mouth 2 (two) times daily as needed.     Allergies:   Mucinex [guaifenesin er] and Sulfonamide derivatives   Social History   Socioeconomic History  . Marital status: Married    Spouse name: Not on file  . Number of children: Not on file  . Years of education: Not on file  . Highest education level: Not on file  Occupational History  . Not on file  Tobacco Use  . Smoking status: Never Smoker  . Smokeless tobacco: Never Used  Substance and Sexual Activity  . Alcohol use: Yes    Comment: occasional  . Drug use: No  . Sexual activity: Yes    Birth control/protection: None  Other Topics Concern  . Not on file  Social History Narrative  . Not on file   Social Determinants of Health   Financial Resource Strain: Not on file  Food Insecurity: Not on  file  Transportation Needs: Not on file  Physical Activity: Not on file  Stress: Not on file  Social Connections: Not on file     Family History: The patient's family history is not on file.  ROS:   Please see the history of present illness.     All other systems reviewed and are negative.  EKGs/Labs/Other Studies Reviewed:    The following studies were reviewed today:   EKG:  EKG is  ordered today.  The ekg ordered today demonstrates normal sinus rhythm, borderline ECG.  Recent Labs: 12/17/2020: BUN 12; Creatinine, Ser 1.27; Hemoglobin 14.7; Platelets 259; Potassium 3.8; Sodium 139  Recent Lipid Panel No results found for: CHOL, TRIG, HDL, CHOLHDL, VLDL, LDLCALC, LDLDIRECT   Risk Assessment/Calculations:      Physical Exam:    VS:  BP 118/70 (BP Location: Left Arm, Patient Position: Sitting, Cuff Size: Normal)   Pulse 81   Ht 6\' 1"  (1.854 m)   Wt 184 lb (83.5 kg)   SpO2 97%   BMI 24.28 kg/m     Wt Readings from Last 3 Encounters:  02/08/21 184 lb (83.5 kg)  12/25/20 184 lb (83.5 kg)  09/21/18 190 lb (86.2 kg)     GEN:  Well nourished, well developed in no acute  distress HEENT: Normal NECK: No JVD; No carotid bruits LYMPHATICS: No lymphadenopathy CARDIAC: RRR, no murmurs, rubs, gallops RESPIRATORY:  Clear to auscultation without rales, wheezing or rhonchi  ABDOMEN: Soft, non-tender, non-distended MUSCULOSKELETAL:  No edema; No deformity  SKIN: Warm and dry NEUROLOGIC:  Alert and oriented x 3 PSYCHIATRIC:  Normal affect   ASSESSMENT:    1. Palpitations   2. Tobacco dipper    PLAN:    In order of problems listed above:  1. History of palpitations, 2-week cardiac monitor did not show any significant arrhythmias.  Patient triggered events were associated with rare PVCs.  Decrease use of energy drinks,coffee advised. 2. Currently chews tobacco, cessation again advised.  Follow-up as needed    Medication Adjustments/Labs and Tests Ordered: Current  medicines are reviewed at length with the patient today.  Concerns regarding medicines are outlined above.  No orders of the defined types were placed in this encounter.  No orders of the defined types were placed in this encounter.   Patient Instructions  Medication Instructions:   1. Your physician recommends that you continue on your current medications as directed. Please refer to the Current Medication list given to you today.  *If you need a refill on your cardiac medications before your next appointment, please call your pharmacy*   Lab Work:  1. None Ordered  If you have labs (blood work) drawn today and your tests are completely normal, you will receive your results only by: Marland Kitchen MyChart Message (if you have MyChart) OR . A paper copy in the mail If you have any lab test that is abnormal or we need to change your treatment, we will call you to review the results.   Testing/Procedures:  1. None Ordered  Follow-Up: At [facility], you and your health needs are our priority.  As part of our continuing mission to provide you with exceptional heart care, we have created designated Provider Care Teams.  These Care Teams include your primary Cardiologist (physician) and Advanced Practice Providers (APPs -  Physician Assistants and Nurse Practitioners) who all work together to provide you with the care you need, when you need it.  We recommend signing up for the patient portal called "MyChart".  Sign up information is provided on this After Visit Summary.  MyChart is used to connect with patients for Virtual Visits (Telemedicine).  Patients are able to view lab/test results, encounter notes, upcoming appointments, etc.  Non-urgent messages can be sent to your provider as well.   To learn more about what you can do with MyChart, go to [email or web address].    Your next appointment:    As needed       Signed, Debbe Odea, MD  02/08/2021 12:32 PM    [facility]

## 2021-02-08 NOTE — Patient Instructions (Signed)
Medication Instructions:   1. Your physician recommends that you continue on your current medications as directed. Please refer to the Current Medication list given to you today.  *If you need a refill on your cardiac medications before your next appointment, please call your pharmacy*   Lab Work:  1. None Ordered  If you have labs (blood work) drawn today and your tests are completely normal, you will receive your results only by: . MyChart Message (if you have MyChart) OR . A paper copy in the mail If you have any lab test that is abnormal or we need to change your treatment, we will call you to review the results.   Testing/Procedures:  1. None Ordered   Follow-Up: At [facility], you and your health needs are our priority.  As part of our continuing mission to provide you with exceptional heart care, we have created designated Provider Care Teams.  These Care Teams include your primary Cardiologist (physician) and Advanced Practice Providers (APPs -  Physician Assistants and Nurse Practitioners) who all work together to provide you with the care you need, when you need it.  We recommend signing up for the patient portal called "MyChart".  Sign up information is provided on this After Visit Summary.  MyChart is used to connect with patients for Virtual Visits (Telemedicine).  Patients are able to view lab/test results, encounter notes, upcoming appointments, etc.  Non-urgent messages can be sent to your provider as well.   To learn more about what you can do with MyChart, go to [email or web address].    Your next appointment:   As needed  

## 2021-05-07 ENCOUNTER — Ambulatory Visit
Admission: RE | Admit: 2021-05-07 | Discharge: 2021-05-07 | Disposition: A | Discharge status: Home / Self Care | Payer: Self-pay | Source: Ambulatory Visit | Attending: Nurse Practitioner | Admitting: Nurse Practitioner

## 2021-05-07 ENCOUNTER — Other Ambulatory Visit: Payer: Self-pay

## 2021-05-07 ENCOUNTER — Other Ambulatory Visit: Payer: Self-pay | Admitting: Nurse Practitioner

## 2021-05-07 DIAGNOSIS — T1490XA Injury, unspecified, initial encounter: Secondary | ICD-10-CM

## 2021-05-07 DIAGNOSIS — M25519 Pain in unspecified shoulder: Secondary | ICD-10-CM

## 2024-02-26 ENCOUNTER — Observation Stay (HOSPITAL_COMMUNITY)
Admission: EM | Admit: 2024-02-26 | Discharge: 2024-02-29 | Disposition: A | Discharge status: Home / Self Care | Attending: Emergency Medicine | Admitting: Emergency Medicine

## 2024-02-26 ENCOUNTER — Emergency Department (HOSPITAL_COMMUNITY)

## 2024-02-26 ENCOUNTER — Other Ambulatory Visit: Payer: Self-pay

## 2024-02-26 ENCOUNTER — Encounter (HOSPITAL_COMMUNITY): Payer: Self-pay

## 2024-02-26 DIAGNOSIS — Z23 Encounter for immunization: Secondary | ICD-10-CM | POA: Diagnosis not present

## 2024-02-26 DIAGNOSIS — Z1152 Encounter for screening for COVID-19: Secondary | ICD-10-CM | POA: Diagnosis not present

## 2024-02-26 DIAGNOSIS — E876 Hypokalemia: Secondary | ICD-10-CM | POA: Diagnosis not present

## 2024-02-26 DIAGNOSIS — F1090 Alcohol use, unspecified, uncomplicated: Secondary | ICD-10-CM | POA: Insufficient documentation

## 2024-02-26 DIAGNOSIS — R290 Tetany: Principal | ICD-10-CM | POA: Insufficient documentation

## 2024-02-26 DIAGNOSIS — M62838 Other muscle spasm: Secondary | ICD-10-CM | POA: Diagnosis present

## 2024-02-26 DIAGNOSIS — J209 Acute bronchitis, unspecified: Principal | ICD-10-CM | POA: Insufficient documentation

## 2024-02-26 DIAGNOSIS — R0603 Acute respiratory distress: Secondary | ICD-10-CM | POA: Diagnosis present

## 2024-02-26 DIAGNOSIS — J40 Bronchitis, not specified as acute or chronic: Secondary | ICD-10-CM | POA: Diagnosis present

## 2024-02-26 DIAGNOSIS — R7401 Elevation of levels of liver transaminase levels: Secondary | ICD-10-CM | POA: Diagnosis not present

## 2024-02-26 LAB — COMPREHENSIVE METABOLIC PANEL WITH GFR
ALT: 37 U/L (ref 0–44)
AST: 46 U/L — ABNORMAL HIGH (ref 15–41)
Albumin: 3.8 g/dL (ref 3.5–5.0)
Alkaline Phosphatase: 32 U/L — ABNORMAL LOW (ref 38–126)
Anion gap: 16 — ABNORMAL HIGH (ref 5–15)
BUN: 6 mg/dL (ref 6–20)
CO2: 19 mmol/L — ABNORMAL LOW (ref 22–32)
Calcium: 8.8 mg/dL — ABNORMAL LOW (ref 8.9–10.3)
Chloride: 105 mmol/L (ref 98–111)
Creatinine, Ser: 1.17 mg/dL (ref 0.61–1.24)
GFR, Estimated: >60 mL/min (ref >60)
Glucose, Bld: 90 mg/dL (ref 70–99)
Potassium: 3.3 mmol/L — ABNORMAL LOW (ref 3.5–5.1)
Sodium: 140 mmol/L (ref 135–145)
Total Bilirubin: 0.6 mg/dL (ref 0.0–1.2)
Total Protein: 6.7 g/dL (ref 6.5–8.1)

## 2024-02-26 LAB — I-STAT VENOUS BLOOD GAS, ED
Acid-base deficit: 3 mmol/L — ABNORMAL HIGH (ref 0.0–2.0)
Bicarbonate: 20.6 mmol/L (ref 20.0–28.0)
Calcium, Ion: 1.1 mmol/L — ABNORMAL LOW (ref 1.15–1.40)
HCT: 42 % (ref 39.0–52.0)
Hemoglobin: 14.3 g/dL (ref 13.0–17.0)
O2 Saturation: 76 %
Potassium: 3.3 mmol/L — ABNORMAL LOW (ref 3.5–5.1)
Sodium: 144 mmol/L (ref 135–145)
TCO2: 22 mmol/L (ref 22–32)
pCO2, Ven: 32.8 mmHg — ABNORMAL LOW (ref 44–60)
pH, Ven: 7.406 (ref 7.25–7.43)
pO2, Ven: 40 mmHg (ref 32–45)

## 2024-02-26 LAB — CBC
HCT: 40.5 % (ref 39.0–52.0)
Hemoglobin: 14.1 g/dL (ref 13.0–17.0)
MCH: 32.2 pg (ref 26.0–34.0)
MCHC: 34.8 g/dL (ref 30.0–36.0)
MCV: 92.5 fL (ref 80.0–100.0)
Platelets: 251 10*3/uL (ref 150–400)
RBC: 4.38 MIL/uL (ref 4.22–5.81)
RDW: 12.1 % (ref 11.5–15.5)
WBC: 8.3 10*3/uL (ref 4.0–10.5)
nRBC: 0 % (ref 0.0–0.2)

## 2024-02-26 LAB — I-STAT CHEM 8, ED
BUN: 4 mg/dL — ABNORMAL LOW (ref 6–20)
Calcium, Ion: 1.11 mmol/L — ABNORMAL LOW (ref 1.15–1.40)
Chloride: 105 mmol/L (ref 98–111)
Creatinine, Ser: 1.6 mg/dL — ABNORMAL HIGH (ref 0.61–1.24)
Glucose, Bld: 89 mg/dL (ref 70–99)
HCT: 41 % (ref 39.0–52.0)
Hemoglobin: 13.9 g/dL (ref 13.0–17.0)
Potassium: 3.2 mmol/L — ABNORMAL LOW (ref 3.5–5.1)
Sodium: 140 mmol/L (ref 135–145)
TCO2: 19 mmol/L — ABNORMAL LOW (ref 22–32)

## 2024-02-26 LAB — COOXEMETRY PANEL
Carboxyhemoglobin: 1 % (ref 0.5–1.5)
Methemoglobin: 1.3 % (ref 0.0–1.5)
O2 Saturation: 99.2 %
Total hemoglobin: 14 g/dL (ref 12.0–16.0)

## 2024-02-26 LAB — MAGNESIUM: Magnesium: 1.9 mg/dL (ref 1.7–2.4)

## 2024-02-26 LAB — TROPONIN I (HIGH SENSITIVITY): Troponin I (High Sensitivity): 2 ng/L (ref <18)

## 2024-02-26 LAB — CK: Total CK: 162 U/L (ref 49–397)

## 2024-02-26 MED ORDER — METHYLPREDNISOLONE SODIUM SUCC 125 MG IJ SOLR
125.0000 mg | Freq: Once | INTRAMUSCULAR | Status: AC
  Administered 2024-02-26: 125 mg via INTRAVENOUS
  Filled 2024-02-26: qty 2

## 2024-02-26 MED ORDER — ALBUTEROL SULFATE (2.5 MG/3ML) 0.083% IN NEBU
15.0000 mg/h | INHALATION_SOLUTION | Freq: Once | RESPIRATORY_TRACT | Status: AC
  Administered 2024-02-26: 15 mg/h via RESPIRATORY_TRACT
  Filled 2024-02-26: qty 18

## 2024-02-26 MED ORDER — SODIUM CHLORIDE 0.9 % IV BOLUS
1000.0000 mL | Freq: Once | INTRAVENOUS | Status: AC
  Administered 2024-02-26: 1000 mL via INTRAVENOUS

## 2024-02-26 MED ORDER — DIAZEPAM 5 MG/ML IJ SOLN
5.0000 mg | Freq: Once | INTRAMUSCULAR | Status: AC
  Administered 2024-02-26: 5 mg via INTRAVENOUS
  Filled 2024-02-26: qty 2

## 2024-02-26 MED ORDER — MAGNESIUM SULFATE 2 GM/50ML IV SOLN
2.0000 g | Freq: Once | INTRAVENOUS | Status: AC
  Administered 2024-02-26: 2 g via INTRAVENOUS
  Filled 2024-02-26: qty 50

## 2024-02-26 MED ORDER — IPRATROPIUM BROMIDE 0.02 % IN SOLN
1.0000 mg | Freq: Once | RESPIRATORY_TRACT | Status: AC
  Administered 2024-02-26: 1 mg via RESPIRATORY_TRACT
  Filled 2024-02-26: qty 5

## 2024-02-26 NOTE — ED Triage Notes (Signed)
 Pt is coming from home with resp distress, he is a IT sales professional and worked Physicist, medical a few days ago, he dd=id try albuterol  at home with no effect. He is cramped up in his arms and legs, and he has had some nausea associated with this. He did have rhonchi with medic but the breathing treatments they gave helped. Pt has Hx of getting bronchitis and pneumonia usually every year around this time, but has never been to this level before.   Medications   Albuterol  and atrovent   Zofran  LR given   Medic vitals   130/90 130hr 97%ra 109bgl   18g rt forearm

## 2024-02-27 ENCOUNTER — Encounter (HOSPITAL_COMMUNITY): Payer: Self-pay | Admitting: Internal Medicine

## 2024-02-27 DIAGNOSIS — R7401 Elevation of levels of liver transaminase levels: Secondary | ICD-10-CM

## 2024-02-27 DIAGNOSIS — E876 Hypokalemia: Secondary | ICD-10-CM | POA: Diagnosis present

## 2024-02-27 DIAGNOSIS — E878 Other disorders of electrolyte and fluid balance, not elsewhere classified: Secondary | ICD-10-CM

## 2024-02-27 DIAGNOSIS — J40 Bronchitis, not specified as acute or chronic: Secondary | ICD-10-CM | POA: Diagnosis not present

## 2024-02-27 DIAGNOSIS — F1090 Alcohol use, unspecified, uncomplicated: Secondary | ICD-10-CM | POA: Diagnosis present

## 2024-02-27 DIAGNOSIS — A35 Other tetanus: Secondary | ICD-10-CM

## 2024-02-27 DIAGNOSIS — M62838 Other muscle spasm: Secondary | ICD-10-CM | POA: Diagnosis not present

## 2024-02-27 DIAGNOSIS — R0603 Acute respiratory distress: Secondary | ICD-10-CM | POA: Diagnosis present

## 2024-02-27 LAB — RESPIRATORY PANEL BY PCR

## 2024-02-27 LAB — CBC
HCT: 38.8 % — ABNORMAL LOW (ref 39.0–52.0)
Hemoglobin: 13.2 g/dL (ref 13.0–17.0)
MCH: 31.4 pg (ref 26.0–34.0)
MCHC: 34 g/dL (ref 30.0–36.0)
MCV: 92.2 fL (ref 80.0–100.0)
Platelets: 237 10*3/uL (ref 150–400)
RBC: 4.21 MIL/uL — ABNORMAL LOW (ref 4.22–5.81)
RDW: 12.4 % (ref 11.5–15.5)
WBC: 6.2 10*3/uL (ref 4.0–10.5)
nRBC: 0 % (ref 0.0–0.2)

## 2024-02-27 LAB — COMPREHENSIVE METABOLIC PANEL WITH GFR
ALT: 35 U/L (ref 0–44)
AST: 45 U/L — ABNORMAL HIGH (ref 15–41)
Albumin: 3.3 g/dL — ABNORMAL LOW (ref 3.5–5.0)
Alkaline Phosphatase: 32 U/L — ABNORMAL LOW (ref 38–126)
Anion gap: 10 (ref 5–15)
BUN: <5 mg/dL — ABNORMAL LOW (ref 6–20)
CO2: 22 mmol/L (ref 22–32)
Calcium: 9.2 mg/dL (ref 8.9–10.3)
Chloride: 108 mmol/L (ref 98–111)
Creatinine, Ser: 1.05 mg/dL (ref 0.61–1.24)
GFR, Estimated: >60 mL/min (ref >60)
Glucose, Bld: 157 mg/dL — ABNORMAL HIGH (ref 70–99)
Potassium: 4.2 mmol/L (ref 3.5–5.1)
Sodium: 140 mmol/L (ref 135–145)
Total Bilirubin: 0.7 mg/dL (ref 0.0–1.2)
Total Protein: 6.4 g/dL — ABNORMAL LOW (ref 6.5–8.1)

## 2024-02-27 LAB — CK: Total CK: 321 U/L (ref 49–397)

## 2024-02-27 LAB — RESP PANEL BY RT-PCR (RSV, FLU A&B, COVID)  RVPGX2
Influenza A by PCR: NEGATIVE
Influenza B by PCR: NEGATIVE
Resp Syncytial Virus by PCR: NEGATIVE
SARS Coronavirus 2 by RT PCR: NEGATIVE

## 2024-02-27 LAB — TROPONIN I (HIGH SENSITIVITY): Troponin I (High Sensitivity): 4 ng/L (ref <18)

## 2024-02-27 LAB — HIV ANTIBODY (ROUTINE TESTING W REFLEX): HIV Screen 4th Generation wRfx: NONREACTIVE

## 2024-02-27 MED ORDER — ACETAMINOPHEN 325 MG PO TABS
650.0000 mg | ORAL_TABLET | Freq: Four times a day (QID) | ORAL | Status: DC | PRN
  Filled 2024-02-27: qty 2

## 2024-02-27 MED ORDER — TETANUS-DIPHTH-ACELL PERTUSSIS 5-2.5-18.5 LF-MCG/0.5 IM SUSY
0.5000 mL | PREFILLED_SYRINGE | Freq: Once | INTRAMUSCULAR | Status: AC
  Administered 2024-02-27: 0.5 mL via INTRAMUSCULAR
  Filled 2024-02-27: qty 0.5

## 2024-02-27 MED ORDER — ACETAMINOPHEN 650 MG RE SUPP: 650.0000 mg | Freq: Four times a day (QID) | RECTAL | Status: DC | PRN

## 2024-02-27 MED ORDER — METHOCARBAMOL 500 MG PO TABS
500.0000 mg | ORAL_TABLET | Freq: Four times a day (QID) | ORAL | Status: DC | PRN
  Administered 2024-02-27 – 2024-02-28 (×5): 500 mg via ORAL
  Filled 2024-02-27 (×5): qty 1

## 2024-02-27 MED ORDER — FENTANYL CITRATE PF 50 MCG/ML IJ SOSY
50.0000 ug | PREFILLED_SYRINGE | Freq: Once | INTRAMUSCULAR | Status: AC
  Administered 2024-02-27: 50 ug via INTRAVENOUS
  Filled 2024-02-27: qty 1

## 2024-02-27 MED ORDER — POTASSIUM CHLORIDE CRYS ER 20 MEQ PO TBCR
40.0000 meq | EXTENDED_RELEASE_TABLET | Freq: Once | ORAL | Status: AC
  Administered 2024-02-27: 40 meq via ORAL
  Filled 2024-02-27: qty 2

## 2024-02-27 MED ORDER — CALCIUM GLUCONATE-NACL 2-0.675 GM/100ML-% IV SOLN
2.0000 g | Freq: Once | INTRAVENOUS | Status: AC
  Administered 2024-02-27: 2000 mg via INTRAVENOUS
  Filled 2024-02-27: qty 100

## 2024-02-27 MED ORDER — ADULT MULTIVITAMIN W/MINERALS CH
1.0000 | ORAL_TABLET | Freq: Every day | ORAL | Status: DC
Start: 2024-02-27 — End: 2024-02-29
  Administered 2024-02-27 – 2024-02-29 (×3): 1 via ORAL
  Filled 2024-02-27 (×3): qty 1

## 2024-02-27 MED ORDER — THIAMINE MONONITRATE 100 MG PO TABS
100.0000 mg | ORAL_TABLET | Freq: Every day | ORAL | Status: DC
  Administered 2024-02-27 – 2024-02-29 (×3): 100 mg via ORAL
  Filled 2024-02-27 (×3): qty 1

## 2024-02-27 MED ORDER — IPRATROPIUM-ALBUTEROL 0.5-2.5 (3) MG/3ML IN SOLN: 3.0000 mL | Freq: Four times a day (QID) | RESPIRATORY_TRACT | Status: DC | PRN

## 2024-02-27 MED ORDER — OXYCODONE HCL 5 MG PO TABS
5.0000 mg | ORAL_TABLET | ORAL | Status: DC | PRN
  Administered 2024-02-27 – 2024-02-28 (×5): 5 mg via ORAL
  Filled 2024-02-27 (×5): qty 1

## 2024-02-27 MED ORDER — FENTANYL CITRATE PF 50 MCG/ML IJ SOSY
12.5000 ug | PREFILLED_SYRINGE | INTRAMUSCULAR | Status: DC | PRN
  Administered 2024-02-27 (×5): 12.5 ug via INTRAVENOUS
  Filled 2024-02-27 (×6): qty 1

## 2024-02-27 MED ORDER — DIAZEPAM 5 MG/ML IJ SOLN
2.5000 mg | Freq: Once | INTRAMUSCULAR | Status: AC
  Administered 2024-02-27: 2.5 mg via INTRAVENOUS
  Filled 2024-02-27: qty 2

## 2024-02-27 MED ORDER — LORAZEPAM 2 MG/ML IJ SOLN: 1.0000 mg | INTRAMUSCULAR | Status: DC | PRN

## 2024-02-27 MED ORDER — THIAMINE HCL 100 MG/ML IJ SOLN
100.0000 mg | Freq: Every day | INTRAMUSCULAR | Status: DC
  Filled 2024-02-27: qty 2

## 2024-02-27 MED ORDER — MAGNESIUM OXIDE -MG SUPPLEMENT 400 (240 MG) MG PO TABS
400.0000 mg | ORAL_TABLET | Freq: Every day | ORAL | Status: DC
  Administered 2024-02-28 – 2024-02-29 (×2): 400 mg via ORAL
  Filled 2024-02-27 (×2): qty 1

## 2024-02-27 MED ORDER — ONDANSETRON HCL 4 MG/2ML IJ SOLN
4.0000 mg | Freq: Four times a day (QID) | INTRAMUSCULAR | Status: DC | PRN
Start: 2024-02-27 — End: 2024-02-29

## 2024-02-27 MED ORDER — LORAZEPAM 1 MG PO TABS: 1.0000 mg | ORAL_TABLET | ORAL | Status: DC | PRN

## 2024-02-27 MED ORDER — DIAZEPAM 5 MG/ML IJ SOLN
2.5000 mg | Freq: Four times a day (QID) | INTRAMUSCULAR | Status: DC | PRN
  Administered 2024-02-27 – 2024-02-29 (×7): 2.5 mg via INTRAVENOUS
  Filled 2024-02-27 (×8): qty 2

## 2024-02-27 MED ORDER — FLUTICASONE PROPIONATE 50 MCG/ACT NA SUSP
2.0000 | Freq: Every day | NASAL | Status: DC
  Administered 2024-02-27 – 2024-02-29 (×3): 2 via NASAL
  Filled 2024-02-27: qty 16

## 2024-02-27 MED ORDER — SODIUM CHLORIDE 0.9% FLUSH
3.0000 mL | Freq: Two times a day (BID) | INTRAVENOUS | Status: DC
  Administered 2024-02-27 – 2024-02-29 (×5): 3 mL via INTRAVENOUS

## 2024-02-27 MED ORDER — FOLIC ACID 1 MG PO TABS
1.0000 mg | ORAL_TABLET | Freq: Every day | ORAL | Status: DC
  Administered 2024-02-27 – 2024-02-29 (×3): 1 mg via ORAL
  Filled 2024-02-27 (×3): qty 1

## 2024-02-27 MED ORDER — FENTANYL CITRATE PF 50 MCG/ML IJ SOSY: 12.5000 ug | PREFILLED_SYRINGE | INTRAMUSCULAR | Status: DC | PRN

## 2024-02-27 MED ORDER — PREDNISONE 20 MG PO TABS
40.0000 mg | ORAL_TABLET | Freq: Every day | ORAL | Status: DC
  Administered 2024-02-28 – 2024-02-29 (×2): 40 mg via ORAL
  Filled 2024-02-27 (×2): qty 2

## 2024-02-27 MED ORDER — SODIUM CHLORIDE 0.9 % IV SOLN: INTRAVENOUS | Status: DC

## 2024-02-27 MED ORDER — ONDANSETRON HCL 4 MG PO TABS: 4.0000 mg | ORAL_TABLET | Freq: Four times a day (QID) | ORAL | Status: DC | PRN

## 2024-02-27 MED ORDER — ENOXAPARIN SODIUM 40 MG/0.4ML IJ SOSY
40.0000 mg | PREFILLED_SYRINGE | INTRAMUSCULAR | Status: DC
  Administered 2024-02-27 – 2024-02-28 (×2): 40 mg via SUBCUTANEOUS
  Filled 2024-02-27 (×2): qty 0.4
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
## 2024-02-27 NOTE — Evaluation (Signed)
 Physical Therapy Evaluation Patient Details Name: [name] MRN: [id number] DOB: [birth year] Today's Date: 02/27/2024  History of Present Illness  38 y.o. male admitted 4/25 with suspected bronchitis, muscle spasms, Hypokalemia , Hypocalcemia. PMH: annual bronchitis.  Clinical Impression  Pt admitted with above diagnosis. Manual muscle testing limited by spasms and tremors in LEs with attempt in supine, pain limited. However, able to stand and fully bear weight through LEs without spams/tremors. Required min assist for bed mobility and supervision for sit to stand transfer from bed and toilet, and short distance ambulation with RW for support. Slow and guarded. Reports muscle soreness in UEs and LEs, all joints feel as if they are burning. No clonus with ankle jerk, bil patellar DTRs 1+. Wife able to assist at d/c. May benefit from OPPT (Neuro,) at d/c if symptoms and deficits slow to resolve. Pt currently with functional limitations due to the deficits listed below (see PT Problem List). Pt will benefit from acute skilled PT to increase their independence and safety with mobility to allow discharge.           If plan is discharge home, recommend the following: A little help with walking and/or transfers;A little help with bathing/dressing/bathroom;Assistance with cooking/housework;Assist for transportation;Help with stairs or ramp for entrance   Can travel by private vehicle        Equipment Recommendations  (Pending progress, possibly RW if slow recovery)  Recommendations for Other Services       Functional Status Assessment Patient has had a recent decline in their functional status and demonstrates the ability to make significant improvements in function in a reasonable and predictable amount of time.     Precautions / Restrictions Precautions Precautions: Fall Restrictions Weight Bearing Restrictions Per Provider Order: No      Mobility  Bed Mobility Overal bed mobility:  Needs Assistance Bed Mobility: Supine to Sit     Supine to sit: Min assist     General bed mobility comments: Assisted by wife to rise to EOB. Able to return to bed, supine, without assist.    Transfers Overall transfer level: Needs assistance Equipment used: Rolling walker (2 wheels) Transfers: Sit to/from Stand Sit to Stand: Supervision           General transfer comment: Supervision for safety, slow to rise. RW for support upon standing. Performed from bed and toilet.    Ambulation/Gait Ambulation/Gait assistance: Supervision Gait Distance (Feet): 15 Feet (x2) Assistive device: Rolling walker (2 wheels) Gait Pattern/deviations: Step-through pattern, Decreased stride length, Trunk flexed Gait velocity: dec Gait velocity interpretation: <1.31 ft/sec, indicative of household ambulator   General Gait Details: Increased reliance on UEs for suppor through RW which was adjusted for height. No tremors or spasticity noted with gait, as was present when supine in bed. No buckling. Assisted to restroom and back with supervision for safety, slow and guarded gait.  Stairs            Wheelchair Mobility     Tilt Bed    Modified Rankin (Stroke Patients Only)       Balance Overall balance assessment: Needs assistance Sitting-balance support: No upper extremity supported, Feet supported Sitting balance-Leahy Scale: Fair     Standing balance support: No upper extremity supported, During functional activity Standing balance-Leahy Scale: Fair Standing balance comment: stood at toilet without UEs, feels more confident with RW support.  Pertinent Vitals/Pain Pain Assessment Pain Assessment: Faces Faces Pain Scale: Hurts little more Pain Location: muscles UEs, LEs Pain Descriptors / Indicators: Sore ("Joints burning") Pain Intervention(s): Limited activity within patient's tolerance, Monitored during session, Repositioned     Home Living Family/patient expects to be discharged to:: Private residence Living Arrangements: Spouse/significant other;Children Available Help at Discharge: Family;Available PRN/intermittently Type of Home: House Home Access: Stairs to enter Entrance Stairs-Rails: None Entrance Stairs-Number of Steps: 3   Home Layout: Two level;Full bath on main level Home Equipment: None      Prior Function Prior Level of Function : Independent/Modified Independent;Working/employed;Driving             Mobility Comments: ind, no device, works as IT sales professional. 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 Not spastic when weight bearing and walking. RLE: Unable to fully assess due to pain RLE Sensation: WNL (States sensory changes have resolved but were in bil feet and hands) LLE Deficits / Details: Difficult to assess due to muscle spasms each attempt. Not spastic when weight bearing and walking. LLE: Unable to fully assess due to pain LLE Sensation: WNL (States sensory changes have resolved but were in bil feet and hands)       Communication   Communication Communication: No apparent difficulties    Cognition Arousal: Alert Behavior During Therapy: WFL for tasks assessed/performed   PT - Cognitive impairments: No apparent impairments                         Following commands: Intact       Cueing Cueing Techniques: Verbal cues     General Comments General comments (skin integrity, edema, etc.): VSS throughout.    Exercises     Assessment/Plan    PT Assessment Patient needs continued PT services  PT Problem List Decreased strength;Decreased activity  tolerance;Decreased balance;Decreased mobility;Impaired tone;Pain       PT Treatment Interventions DME instruction;Gait training;Stair training;Functional mobility training;Therapeutic activities;Therapeutic exercise;Balance training;Neuromuscular re-education;Patient/family education;Modalities    PT Goals (Current goals can be found in the Care Plan section)  Acute Rehab PT Goals Patient Stated Goal: Feel better PT Goal Formulation: With patient Time For Goal Achievement: 03/12/24 Potential to Achieve Goals: Good    Frequency Min 2X/week     Co-evaluation               AM-PAC PT "6 Clicks" Mobility  Outcome Measure Help needed turning from your back to your side while in a flat bed without using bedrails?: None Help needed moving from lying on your back to sitting on the side of a flat bed without using bedrails?: A Little Help needed moving to and from a bed to a chair (including a wheelchair)?: A Little Help needed standing up from a chair using your arms (e.g., wheelchair or bedside chair)?: A Little Help needed to walk in hospital room?: A Little Help needed climbing 3-5 steps with a railing? : A Little 6 Click Score: 19    End of Session Equipment Utilized During Treatment: Gait belt Activity Tolerance: Patient limited by pain Patient left: in bed;with call bell/phone within reach;with family/visitor present Nurse Communication: Mobility status PT Visit Diagnosis: Unsteadiness on feet (R26.81);Other abnormalities of  gait and mobility (R26.89);Muscle weakness (generalized) (M62.81);Difficulty in walking, not elsewhere classified (R26.2);Other symptoms and signs involving the nervous system (R29.898);Pain Pain - part of body:  (UE and LE muscles; all joints.)    Time: 0865-7846 PT Time Calculation (min) (ACUTE ONLY): 28 min   Charges:   PT Evaluation $PT Eval Low Complexity: 1 Low PT Treatments $Therapeutic Activity: 8-22 mins PT General Charges $$ ACUTE PT  VISIT: 1 Visit         Jory Ng, PT, DPT [facility]  Rehabilitation Services Physical Therapist Office: [phone] Website: [email or web address]   Alinda Irani 02/27/2024, 2:27 PM

## 2024-02-27 NOTE — Plan of Care (Signed)
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
## 2024-02-27 NOTE — ED Notes (Signed)
Patient transferred to floor.

## 2024-02-27 NOTE — Progress Notes (Signed)
 Received 10 mg of valium  from pharmacy.  2.5 mg given per order.  7.5 mg wasted in approved waste container and witnessed by Chares Commons, RN.  Patient is resting in bed.  Will continue to monitor.

## 2024-02-28 DIAGNOSIS — M62838 Other muscle spasm: Secondary | ICD-10-CM

## 2024-02-28 DIAGNOSIS — E876 Hypokalemia: Secondary | ICD-10-CM | POA: Diagnosis not present

## 2024-02-28 DIAGNOSIS — J40 Bronchitis, not specified as acute or chronic: Secondary | ICD-10-CM | POA: Diagnosis not present

## 2024-02-28 LAB — CBC
HCT: 39.2 % (ref 39.0–52.0)
Hemoglobin: 12.9 g/dL — ABNORMAL LOW (ref 13.0–17.0)
MCH: 31.1 pg (ref 26.0–34.0)
MCHC: 32.9 g/dL (ref 30.0–36.0)
MCV: 94.5 fL (ref 80.0–100.0)
Platelets: 206 10*3/uL (ref 150–400)
RBC: 4.15 MIL/uL — ABNORMAL LOW (ref 4.22–5.81)
RDW: 12.4 % (ref 11.5–15.5)
WBC: 7.4 10*3/uL (ref 4.0–10.5)
nRBC: 0 % (ref 0.0–0.2)

## 2024-02-28 LAB — BASIC METABOLIC PANEL WITH GFR
Anion gap: 5 (ref 5–15)
BUN: 9 mg/dL (ref 6–20)
CO2: 26 mmol/L (ref 22–32)
Calcium: 8.7 mg/dL — ABNORMAL LOW (ref 8.9–10.3)
Chloride: 108 mmol/L (ref 98–111)
Creatinine, Ser: 1.01 mg/dL (ref 0.61–1.24)
GFR, Estimated: >60 mL/min (ref >60)
Glucose, Bld: 95 mg/dL (ref 70–99)
Potassium: 4 mmol/L (ref 3.5–5.1)
Sodium: 139 mmol/L (ref 135–145)

## 2024-02-28 LAB — TSH: TSH: 2.816 u[IU]/mL (ref 0.350–4.500)

## 2024-02-28 LAB — ALBUMIN: Albumin: 3.1 g/dL — ABNORMAL LOW (ref 3.5–5.0)

## 2024-02-28 LAB — MAGNESIUM: Magnesium: 2.1 mg/dL (ref 1.7–2.4)

## 2024-02-28 MED ORDER — CALCIUM GLUCONATE-NACL 1-0.675 GM/50ML-% IV SOLN
1.0000 g | Freq: Once | INTRAVENOUS | Status: AC
  Administered 2024-02-28: 1000 mg via INTRAVENOUS
  Filled 2024-02-28: qty 50

## 2024-02-28 MED ORDER — FLUTICASONE FUROATE-VILANTEROL 200-25 MCG/ACT IN AEPB
1.0000 | INHALATION_SPRAY | Freq: Every day | RESPIRATORY_TRACT | Status: DC
  Administered 2024-02-29: 1 via RESPIRATORY_TRACT
  Filled 2024-02-28: qty 28

## 2024-02-28 MED ORDER — LORATADINE 10 MG PO TABS
10.0000 mg | ORAL_TABLET | Freq: Every day | ORAL | Status: DC
  Administered 2024-02-28 – 2024-02-29 (×2): 10 mg via ORAL
  Filled 2024-02-28 (×2): qty 1

## 2024-02-28 NOTE — Progress Notes (Signed)
 PROGRESS NOTE        PATIENT DETAILS Name: [name] Age: 38 y.o. Sex: male Date of Birth: 07-01-1986 Admit Date: 02/26/2024 Admitting Physician Walton Guppy, MD MWN:[id number], Provider, MD  Brief Summary: Patient is a 38 y.o.  male with history of recurrent episodes of bronchitis (2-3 times a year) presented to the hospital with respiratory distress likely due to acute bronchitis and muscle spasm consistent with tetany.  Significant events: 4/25>> admit to [facility]  Significant studies: 4/25>> CXR: No PNA  Significant microbiology data: 4/26>> COVID/influenza/RSV PCR: Negative 4/26>> respiratory virus panel: Negative  Procedures: None  Consults: ID  Subjective: Lying comfortably in bed-denies any chest pain or shortness of breath.  Feels much better.  No muscle spasm/tetany like symptoms overnight.  Objective: Vitals: Blood pressure 114/76, pulse 66, temperature 97.6 F (36.4 C), temperature source Oral, resp. rate 14, height 6\' 1"  (1.854 m), weight 87.2 kg, SpO2 95%.   Exam: Gen Exam:Alert awake-not in any distress HEENT:atraumatic, normocephalic Chest: B/L clear to auscultation anteriorly CVS:S1S2 regular Abdomen:soft non tender, non distended Extremities:no edema Neurology: Non focal Skin: no rash  Pertinent Labs/Radiology:    Latest Ref Rng & Units 02/28/2024    6:00 AM 02/27/2024    9:55 AM 02/26/2024   11:25 PM  CBC  WBC 4.0 - 10.5 K/uL 7.4  6.2    Hemoglobin 13.0 - 17.0 g/dL 64.4  03.4  74.2   Hematocrit 39.0 - 52.0 % 39.2  38.8  41.0   Platelets 150 - 400 K/uL 206  237      Lab Results  Component Value Date   NA 139 02/28/2024   K 4.0 02/28/2024   CL 108 02/28/2024   CO2 26 02/28/2024      Assessment/Plan: Tetany/muscle spasms Suspect this is related to hyperventilation in the setting of acute bronchitis compounded by anxiety.Reviewed literature/up-to-date-this can occur in spite of normal calcium levels.  His  ionized calcium levels were on the lower side when he first presented.  Magnesium  levels are normal. No further episodes overnight Check PTH-due to borderline low calcium levels. Stop IVF 1 more dose of IV calcium gluconate-1 g.  Acute bronchitis No longer wheezing After discussion with patient-he gets these episodes of "acute bronchitis" at least 2 or 3 times a year.  He previously has been prescribed a steroid inhaler. I suspect he could have a propensity for asthma-and will need outpatient PFTs Start inhaled steroids/LABA as an inpatient-will prescribe on discharge Quick oral steroid taper Continue belies bronchodilators while inpatient.  Hypokalemia Repleted Does not take diuretics  Hypocalcemia Borderline PTH/vitamin D2/vitamin D3 levels pending. See above discussion.  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 No withdrawal symptoms  BMI: Estimated body mass index is 25.36 kg/m as calculated from the following:   Height as of this encounter: 6\' 1"  (1.854 m).   Weight as of this encounter: 87.2 kg.   Code status:   Code Status: Full Code   DVT Prophylaxis: enoxaparin (LOVENOX) injection 40 mg Start: 02/27/24 1400   Family Communication: Spouse at bedside   Disposition Plan: Status is: Observation The patient will require care spanning > 2 midnights and should be moved to inpatient because: Severity of illness   Planned Discharge Destination:Home-either later today or tomorrow.   Diet: Diet Order  Diet regular Room service appropriate? Yes; Fluid consistency: Thin  Diet effective now                     Antimicrobial agents: Anti-infectives (From admission, onward)    None        MEDICATIONS: Scheduled Meds:  enoxaparin (LOVENOX) injection  40 mg Subcutaneous Q24H   fluticasone   2 spray Each Nare Daily   folic acid  1 mg Oral Daily   magnesium  oxide  400 mg Oral Daily   multivitamin with minerals  1 tablet Oral Daily    predniSONE  40 mg Oral Q breakfast   sodium chloride  flush  3 mL Intravenous Q12H   thiamine  100 mg Oral Daily   Or   thiamine  100 mg Intravenous Daily   Continuous Infusions:  calcium gluconate     PRN Meds:.acetaminophen  **OR** acetaminophen , diazepam , fentaNYL (SUBLIMAZE) injection, ipratropium-albuterol , LORazepam **OR** LORazepam, methocarbamol, ondansetron **OR** ondansetron (ZOFRAN) IV, oxyCODONE   I have personally reviewed following labs and imaging studies  LABORATORY DATA: CBC: Recent Labs  Lab 02/26/24 2243 02/26/24 2319 02/26/24 2325 02/27/24 0955 02/28/24 0600  WBC 8.3  --   --  6.2 7.4  HGB 14.1 14.3 13.9 13.2 12.9*  HCT 40.5 42.0 41.0 38.8* 39.2  MCV 92.5  --   --  92.2 94.5  PLT 251  --   --  237 206    Basic Metabolic Panel: Recent Labs  Lab 02/26/24 2250 02/26/24 2319 02/26/24 2325 02/27/24 0955 02/28/24 0600  NA 140 144 140 140 139  K 3.3* 3.3* 3.2* 4.2 4.0  CL 105  --  105 108 108  CO2 19*  --   --  22 26  GLUCOSE 90  --  89 157* 95  BUN 6  --  4* <5* 9  CREATININE 1.17  --  1.60* 1.05 1.01  CALCIUM 8.8*  --   --  9.2 8.7*  MG 1.9  --   --   --   --     GFR: Estimated Creatinine Clearance: 114.3 mL/min (by C-G formula based on SCr of 1.01 mg/dL).  Liver Function Tests: Recent Labs  Lab 02/26/24 2250 02/27/24 0955  AST 46* 45*  ALT 37 35  ALKPHOS 32* 32*  BILITOT 0.6 0.7  PROT 6.7 6.4*  ALBUMIN 3.8 3.3*   No results for input(s): "LIPASE", "AMYLASE" in the last 168 hours. No results for input(s): "AMMONIA" in the last 168 hours.  Coagulation Profile: No results for input(s): "INR", "PROTIME" in the last 168 hours.  Cardiac Enzymes: Recent Labs  Lab 02/26/24 2250 02/27/24 0955  CKTOTAL 162 321    BNP (last 3 results) No results for input(s): "PROBNP" in the last 8760 hours.  Lipid Profile: No results for input(s): "CHOL", "HDL", "LDLCALC", "TRIG", "CHOLHDL", "LDLDIRECT" in the last 72 hours.  Thyroid Function  Tests: No results for input(s): "TSH", "T4TOTAL", "FREET4", "T3FREE", "THYROIDAB" in the last 72 hours.  Anemia Panel: No results for input(s): "VITAMINB12", "FOLATE", "FERRITIN", "TIBC", "IRON", "RETICCTPCT" in the last 72 hours.  Urine analysis:    Component Value Date/Time   COLORURINE YELLOW 02/15/2010 1138   APPEARANCEUR CLEAR 02/15/2010 1138   LABSPEC 1.036 (H) 02/15/2010 1138   PHURINE 5.5 02/15/2010 1138   GLUCOSEU NEGATIVE 02/15/2010 1138   HGBUR NEGATIVE 02/15/2010 1138   BILIRUBINUR NEGATIVE 02/15/2010 1138   KETONESUR NEGATIVE 02/15/2010 1138   PROTEINUR NEGATIVE 02/15/2010 1138   UROBILINOGEN 0.2 02/15/2010 1138  NITRITE NEGATIVE 02/15/2010 1138   LEUKOCYTESUR  02/15/2010 1138    NEGATIVE MICROSCOPIC NOT DONE ON URINES WITH NEGATIVE PROTEIN, BLOOD, LEUKOCYTES, NITRITE, OR GLUCOSE <1000 mg/dL.    Sepsis Labs: Lactic Acid, Venous No results found for: "LATICACIDVEN"  MICROBIOLOGY: Recent Results (from the past 240 hours)  Respiratory (~20 pathogens) panel by PCR     Status: None   Collection Time: 02/27/24 11:40 AM   Specimen: Nasopharyngeal Swab; Respiratory  Result Value Ref Range Status   Adenovirus NOT DETECTED NOT DETECTED Final   Coronavirus 229E NOT DETECTED NOT DETECTED Final    Comment: (NOTE) The Coronavirus on the Respiratory Panel, DOES NOT test for the novel  Coronavirus (2019 nCoV)    Coronavirus HKU1 NOT DETECTED NOT DETECTED Final   Coronavirus NL63 NOT DETECTED NOT DETECTED Final   Coronavirus OC43 NOT DETECTED NOT DETECTED Final   Metapneumovirus NOT DETECTED NOT DETECTED Final   Rhinovirus / Enterovirus NOT DETECTED NOT DETECTED Final   Influenza A NOT DETECTED NOT DETECTED Final   Influenza B NOT DETECTED NOT DETECTED Final   Parainfluenza Virus 1 NOT DETECTED NOT DETECTED Final   Parainfluenza Virus 2 NOT DETECTED NOT DETECTED Final   Parainfluenza Virus 3 NOT DETECTED NOT DETECTED Final   Parainfluenza Virus 4 NOT DETECTED NOT  DETECTED Final   Respiratory Syncytial Virus NOT DETECTED NOT DETECTED Final   Bordetella pertussis NOT DETECTED NOT DETECTED Final   Bordetella Parapertussis NOT DETECTED NOT DETECTED Final   Chlamydophila pneumoniae NOT DETECTED NOT DETECTED Final   Mycoplasma pneumoniae NOT DETECTED NOT DETECTED Final    Comment: Performed at [facility], [address], [place], [place] [address]  Resp panel by RT-PCR (RSV, Flu A&B, Covid) Anterior Nasal Swab     Status: None   Collection Time: 02/27/24 11:40 AM   Specimen: Anterior Nasal Swab  Result Value Ref Range Status   SARS Coronavirus 2 by RT PCR NEGATIVE NEGATIVE Final   Influenza A by PCR NEGATIVE NEGATIVE Final   Influenza B by PCR NEGATIVE NEGATIVE Final    Comment: (NOTE) The Xpert Xpress SARS-CoV-2/FLU/RSV plus assay is intended as an aid in the diagnosis of influenza from Nasopharyngeal swab specimens and should not be used as a sole basis for treatment. Nasal washings and aspirates are unacceptable for Xpert Xpress SARS-CoV-2/FLU/RSV testing.  Fact Sheet for Patients: BloggerCourse.com  Fact Sheet for Healthcare Providers: SeriousBroker.it  This test is not yet approved or cleared by the [place]  FDA and has been authorized for detection and/or diagnosis of SARS-CoV-2 by FDA under an Emergency Use Authorization (EUA). This EUA will remain in effect (meaning this test can be used) for the duration of the COVID-19 declaration under Section 564(b)(1) of the Act, 21 U.S.C. section 360bbb-3(b)(1), unless the authorization is terminated or revoked.     Resp Syncytial Virus by PCR NEGATIVE NEGATIVE Final    Comment: (NOTE) Fact Sheet for Patients: BloggerCourse.com  Fact Sheet for Healthcare Providers: SeriousBroker.it  This test is not yet approved or cleared by the [place]  FDA and has been authorized for  detection and/or diagnosis of SARS-CoV-2 by FDA under an Emergency Use Authorization (EUA). This EUA will remain in effect (meaning this test can be used) for the duration of the COVID-19 declaration under Section 564(b)(1) of the Act, 21 U.S.C. section 360bbb-3(b)(1), unless the authorization is terminated or revoked.  Performed at [facility], [address] [place], [place] [address]     RADIOLOGY  STUDIES/RESULTS: DG Chest Port 1 View Result Date: 02/26/2024 CLINICAL DATA:  SOB EXAM: PORTABLE CHEST 1 VIEW COMPARISON:  Chest x-ray 09/21/2018 FINDINGS: The heart and mediastinal contours are within normal limits. No focal consolidation. No pulmonary edema. No pleural effusion. No pneumothorax. No acute osseous abnormality. IMPRESSION: No active disease. Electronically Signed   By: Morgane  Naveau M.D.   On: 02/26/2024 23:30     LOS: 1 day   Kimberly Penna, MD  [facility]    To contact the attending provider between 7A-7P or the covering provider during after hours 7P-7A, please log into the web site [email or web address] and access using universal [place] password for that web site. If you do not have the password, please call the hospital operator.  02/28/2024, 9:20 AM

## 2024-02-28 NOTE — Plan of Care (Signed)

## 2024-02-29 ENCOUNTER — Other Ambulatory Visit (HOSPITAL_COMMUNITY): Payer: Self-pay

## 2024-02-29 DIAGNOSIS — J40 Bronchitis, not specified as acute or chronic: Secondary | ICD-10-CM | POA: Diagnosis not present

## 2024-02-29 DIAGNOSIS — E876 Hypokalemia: Secondary | ICD-10-CM | POA: Diagnosis not present

## 2024-02-29 DIAGNOSIS — M62838 Other muscle spasm: Secondary | ICD-10-CM | POA: Diagnosis not present

## 2024-02-29 LAB — CALCITRIOL (1,25 DI-OH VIT D): Vit D, 1,25-Dihydroxy: 59.6 pg/mL (ref 24.8–81.5)

## 2024-02-29 LAB — PARATHYROID HORMONE, INTACT (NO CA): PTH: 25 pg/mL (ref 15–65)

## 2024-02-29 MED ORDER — PREDNISONE 10 MG PO TABS
ORAL_TABLET | ORAL | 0 refills | Status: DC
  Filled 2024-02-29: qty 6, 3d supply, fill #0

## 2024-02-29 MED ORDER — LORATADINE 10 MG PO TABS
10.0000 mg | ORAL_TABLET | Freq: Every day | ORAL | 0 refills | Status: AC
  Filled 2024-02-29: qty 14, 14d supply, fill #0

## 2024-02-29 MED ORDER — FLUTICASONE FUROATE-VILANTEROL 200-25 MCG/ACT IN AEPB
1.0000 | INHALATION_SPRAY | Freq: Every day | RESPIRATORY_TRACT | 1 refills | Status: AC
  Filled 2024-02-29: qty 60, 30d supply, fill #0

## 2024-02-29 MED ORDER — CYCLOBENZAPRINE HCL 5 MG PO TABS
5.0000 mg | ORAL_TABLET | Freq: Three times a day (TID) | ORAL | 0 refills | Status: DC | PRN
  Filled 2024-02-29: qty 30, 10d supply, fill #0

## 2024-02-29 MED ORDER — ALBUTEROL SULFATE HFA 108 (90 BASE) MCG/ACT IN AERS
2.0000 | INHALATION_SPRAY | RESPIRATORY_TRACT | 1 refills | Status: AC | PRN
Start: 2024-02-29 — End: ?
  Filled 2024-02-29: qty 6.7, 17d supply, fill #0

## 2024-02-29 MED ORDER — FLUTICASONE PROPIONATE 50 MCG/ACT NA SUSP
2.0000 | Freq: Every day | NASAL | 1 refills | Status: AC
  Filled 2024-02-29: qty 16, 30d supply, fill #0

## 2024-02-29 NOTE — Progress Notes (Signed)
 Discharge instructions reviewed with pt and his wife.  Copy of instructions given to pt. [facility] is filling scripts and will be picked up on the way out for discharge.  Pt getting dressed at this time, meds will be ready in next 10 minutes per pharmacy.  Pt will be d/c'd via wheelchair with belongings, with his wife and will be       escorted by staff/hospital volunteer.  Shelitha Magley,RN SWOT

## 2024-02-29 NOTE — TOC Transition Note (Addendum)
 Transition of Care [facility]) - Discharge Note   Patient Details  Name: [name] MRN: [id number] Date of Birth: [birth year]  Transition [facility] CM/SW Contact:  Eusebio High, RN Phone Number: 02/29/2024, 9:40 AM   Clinical Narrative:     Patient will DC to home today. OPPT (Neuro) recommended and referred to [place] location  AVS has been updated and RNCM has spoken with patient . RNCM acknowledges consult for substance abuse resources. Provider states ETOH use is more of a social drinker than a daily drinker- resources provided for information   Wife will transport           Patient Goals and CMS Choice            Discharge Placement                       Discharge Plan and Services Additional resources added to the After Visit Summary for                                       Social Drivers of Health (SDOH) Interventions SDOH Screenings   Food Insecurity: No Food Insecurity (02/27/2024)  Housing: Low Risk  (02/27/2024)  Transportation Needs: No Transportation Needs (02/27/2024)  Utilities: Not At Risk (02/27/2024)  Social Connections: Socially Integrated (02/27/2024)  Tobacco Use: Low Risk  (02/27/2024)     Readmission Risk Interventions     No data to display

## 2024-02-29 NOTE — Discharge Summary (Signed)
 PATIENT DETAILS Name: [name] Age: 38 y.o. Sex: male Date of Birth: 05-07-1986 MRN: [id number]. Admitting Physician: Walton Guppy, MD WUJ:[id number], Provider, MD  Admit Date: 02/26/2024 Discharge date: 02/29/2024  Recommendations for Outpatient Follow-up:  Follow up with PCP in 1-2 weeks Please obtain CMP/CBC in one week PTH, vitamin D3 levels pending-please follow.   Please ensure follow-up with pulmonology for formal PFTs.  Admitted From:  Home  Disposition: Home   Discharge Condition: good  CODE STATUS:   Code Status: Full Code   Diet recommendation:  Diet Order             Diet general           Diet regular Room service appropriate? Yes; Fluid consistency: Thin  Diet effective now                    Brief Summary: Patient is a 38 y.o.  male with history of recurrent episodes of bronchitis (2-3 times a year) presented to the hospital with respiratory distress likely due to acute bronchitis and muscle spasm consistent with tetany.   Significant events: 4/25>> admit to [facility]   Significant studies: 4/25>> CXR: No PNA   Significant microbiology data: 4/26>> COVID/influenza/RSV PCR: Negative 4/26>> respiratory virus panel: Negative   Procedures: None   Consults: ID  Brief Hospital Course: Tetany/muscle spasms Suspect this is related to hyperventilation in the setting of acute bronchitis compounded by anxiety.Reviewed literature/up-to-date-this can occur in spite of normal calcium levels.  His ionized calcium levels were on the lower side when he first presented-suspect could have been much lower when he was at home.  Magnesium  levels are normal. No further episodes x 2 days PTH/vitamin D levels are pending-he is aware that his PCP will need to follow these results Stable for discharge.   Acute bronchitis No longer wheezing-on room air-lungs completely clear on exam. After discussion with patient-he gets these episodes of "acute  bronchitis" at least 2 or 3 times a year.  He previously has been prescribed a steroid inhaler. I suspect he could have a propensity for asthma-and will need outpatient PFTs-epic referral to pulmonology placed. Continue Breo-continue as needed albuterol  taper He has some nasal congestion-will continue Flonase /antihistamine  on discharge for the next 1-2 weeks Quick steroid taper.   Hypokalemia Repleted No history of reticulocyte use.   Hypocalcemia Borderline PTH/vitamin D2/vitamin D3 levels pending-PCP to follow See above discussion.   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 No withdrawal symptoms   BMI: Estimated body mass index is 25.36 kg/m as calculated from the following:   Height as of this encounter: 6\' 1"  (1.854 m).   Weight as of this encounter: 87.2 kg.    Discharge Diagnoses:  Principal Problem:   Bronchitis Active Problems:   Muscle spasm   Hypocalcemia   Hypokalemia   Alcohol use   Respiratory distress   Discharge Instructions:  Activity:  As tolerated   Discharge Instructions     Call MD for:  difficulty breathing, headache or visual disturbances   Complete by: As directed    Call MD for:  persistant dizziness or light-headedness   Complete by: As directed    Diet general   Complete by: As directed    Discharge instructions   Complete by: As directed    Follow with Primary MD  in 1-2 weeks  Please get a complete blood count and chemistry panel checked by your Primary MD at your next visit,  and again as instructed by your Primary MD.  PTH/vitamin D levels are pending at the time of discharge-please ask your primary care practitioner to follow-up on these results  An epic/electronic referral was sent to [facility] pulmonology-their office will call with a follow-up appointment-if you do not hear from them-please give them a call.  Get Medicines reviewed and adjusted: Please take all your medications with you for your next visit with your  Primary MD  Laboratory/radiological data: Please request your Primary MD to go over all hospital tests and procedure/radiological results at the follow up, please ask your Primary MD to get all Hospital records sent to his/her office.  In some cases, they will be blood work, cultures and biopsy results pending at the time of your discharge. Please request that your primary care M.D. follows up on these results.  Also Note the following: If you experience worsening of your admission symptoms, develop shortness of breath, life threatening emergency, suicidal or homicidal thoughts you must seek medical attention immediately by calling 911 or calling your MD immediately  if symptoms less severe.  You must read complete instructions/literature along with all the possible adverse reactions/side effects for all the Medicines you take and that have been prescribed to you. Take any new Medicines after you have completely understood and accpet all the possible adverse reactions/side effects.   Do not drive when taking Pain medications or sleeping medications (Benzodaizepines)  Do not take more than prescribed Pain, Sleep and Anxiety Medications. It is not advisable to combine anxiety,sleep and pain medications without talking with your primary care practitioner  Special Instructions: If you have smoked or chewed Tobacco  in the last 2 yrs please stop smoking, stop any regular Alcohol  and or any Recreational drug use.  Wear Seat belts while driving.  Please note: You were cared for by a hospitalist during your hospital stay. Once you are discharged, your primary care physician will handle any further medical issues. Please note that NO REFILLS for any discharge medications will be authorized once you are discharged, as it is imperative that you return to your primary care physician (or establish a relationship with a primary care physician if you do not have one) for your post hospital discharge needs so  that they can reassess your need for medications and monitor your lab values.   Increase activity slowly   Complete by: As directed    Pulmonary Visit   Complete by: As directed    Possible undiagnosed Asthma-needs formal PFT's etc   Reason for referral: Other Pulmonary      Allergies as of 02/29/2024       Reactions   Mucinex [guaifenesin Er] Rash   Sulfonamide Derivatives    REACTION: swelling and rash as a child        Medication List     TAKE these medications    albuterol  108 (90 Base) MCG/ACT inhaler Commonly known as: VENTOLIN  HFA Inhale 2 puffs into the lungs every 4 (four) hours as needed for wheezing or shortness of breath.   cyclobenzaprine 5 MG tablet Commonly known as: FLEXERIL Take 1 tablet (5 mg total) by mouth 3 (three) times daily as needed for muscle spasms.   fluticasone  50 MCG/ACT nasal spray Commonly known as: FLONASE  Place 2 sprays into both nostrils daily. Start taking on: March 01, 2024   fluticasone  furoate-vilanterol 200-25 MCG/ACT Aepb Commonly known as: BREO ELLIPTA Inhale 1 puff into the lungs daily. Start taking on: March 01, 2024  loratadine 10 MG tablet Commonly known as: CLARITIN Take 1 tablet (10 mg total) by mouth daily. Start taking on: March 01, 2024   predniSONE 10 MG tablet Commonly known as: DELTASONE Take 30 mg daily for 1 day, 20 mg daily for 1 days,10 mg daily for 1 day, then stop Start taking on: March 01, 2024        Follow-up Information      [facility] Follow up.   Specialty: Pulmonology Why: Office will call with date/time, If you dont hear from them,please give them a call, Hospital follow up Contact information: [address] [place]   [id number] [phone] Additional information: [address]  [address]  [place], [place] [address]        Primary care practitioner. Schedule an appointment as soon as possible for a visit in 1  week(s).                 Allergies  Allergen Reactions   Mucinex [Guaifenesin Er] Rash   Sulfonamide Derivatives     REACTION: swelling and rash as a child     Other Procedures/Studies: DG Chest Port 1 View Result Date: 02/26/2024 CLINICAL DATA:  SOB EXAM: PORTABLE CHEST 1 VIEW COMPARISON:  Chest x-ray 09/21/2018 FINDINGS: The heart and mediastinal contours are within normal limits. No focal consolidation. No pulmonary edema. No pleural effusion. No pneumothorax. No acute osseous abnormality. IMPRESSION: No active disease. Electronically Signed   By: Morgane  Naveau M.D.   On: 02/26/2024 23:30     TODAY-DAY OF DISCHARGE:  Subjective:   [name] today has no headache,no chest abdominal pain,no new weakness tingling or numbness, feels much better wants to go home today.   Objective:   Blood pressure 105/68, pulse (!) 52, temperature 97.8 F (36.6 C), temperature source Oral, resp. rate 11, height 6\' 1"  (1.854 m), weight 69.4 kg, SpO2 98%.  Intake/Output Summary (Last 24 hours) at 02/29/2024 0909 Last data filed at 02/28/2024 1600 Gross per 24 hour  Intake 1484.79 ml  Output --  Net 1484.79 ml   Filed Weights   02/27/24 0900 02/29/24 0615  Weight: 87.2 kg 69.4 kg    Exam: Awake Alert, Oriented *3, No new F.N deficits, Normal affect [place].AT,PERRAL Supple Neck,No JVD, No cervical lymphadenopathy appriciated.  Symmetrical Chest wall movement, Good air movement bilaterally, CTAB RRR,No Gallops,Rubs or new Murmurs, No Parasternal Heave +ve B.Sounds, Abd Soft, Non tender, No organomegaly appriciated, No rebound -guarding or rigidity. No Cyanosis, Clubbing or edema, No new Rash or bruise   PERTINENT RADIOLOGIC STUDIES: No results found.   PERTINENT LAB RESULTS: CBC: Recent Labs    02/27/24 0955 02/28/24 0600  WBC 6.2 7.4  HGB 13.2 12.9*  HCT 38.8* 39.2  PLT 237 206   CMET CMP     Component Value Date/Time   NA 139 02/28/2024 0600   K 4.0 02/28/2024  0600   CL 108 02/28/2024 0600   CO2 26 02/28/2024 0600   GLUCOSE 95 02/28/2024 0600   BUN 9 02/28/2024 0600   CREATININE 1.01 02/28/2024 0600   CREATININE 1.11 02/06/2013 0842   CALCIUM 8.7 (L) 02/28/2024 0600   PROT 6.4 (L) 02/27/2024 0955   ALBUMIN 3.1 (L) 02/28/2024 0905   AST 45 (H) 02/27/2024 0955   ALT 35 02/27/2024 0955   ALKPHOS 32 (L) 02/27/2024 0955   BILITOT 0.7 02/27/2024 0955   GFRNONAA >60 02/28/2024 0600    GFR Estimated Creatinine Clearance:  99.3 mL/min (by C-G formula based on SCr of 1.01 mg/dL). No results for input(s): "LIPASE", "AMYLASE" in the last 72 hours. Recent Labs    02/26/24 2250 02/27/24 0955  CKTOTAL 162 321   Invalid input(s): "POCBNP" No results for input(s): "DDIMER" in the last 72 hours. No results for input(s): "HGBA1C" in the last 72 hours. No results for input(s): "CHOL", "HDL", "LDLCALC", "TRIG", "CHOLHDL", "LDLDIRECT" in the last 72 hours. Recent Labs    02/28/24 0834  TSH 2.816   No results for input(s): "VITAMINB12", "FOLATE", "FERRITIN", "TIBC", "IRON", "RETICCTPCT" in the last 72 hours. Coags: No results for input(s): "INR" in the last 72 hours.  Invalid input(s): "PT" Microbiology: Recent Results (from the past 240 hours)  Respiratory (~20 pathogens) panel by PCR     Status: None   Collection Time: 02/27/24 11:40 AM   Specimen: Nasopharyngeal Swab; Respiratory  Result Value Ref Range Status   Adenovirus NOT DETECTED NOT DETECTED Final   Coronavirus 229E NOT DETECTED NOT DETECTED Final    Comment: (NOTE) The Coronavirus on the Respiratory Panel, DOES NOT test for the novel  Coronavirus (2019 nCoV)    Coronavirus HKU1 NOT DETECTED NOT DETECTED Final   Coronavirus NL63 NOT DETECTED NOT DETECTED Final   Coronavirus OC43 NOT DETECTED NOT DETECTED Final   Metapneumovirus NOT DETECTED NOT DETECTED Final   Rhinovirus / Enterovirus NOT DETECTED NOT DETECTED Final   Influenza A NOT DETECTED NOT DETECTED Final   Influenza B  NOT DETECTED NOT DETECTED Final   Parainfluenza Virus 1 NOT DETECTED NOT DETECTED Final   Parainfluenza Virus 2 NOT DETECTED NOT DETECTED Final   Parainfluenza Virus 3 NOT DETECTED NOT DETECTED Final   Parainfluenza Virus 4 NOT DETECTED NOT DETECTED Final   Respiratory Syncytial Virus NOT DETECTED NOT DETECTED Final   Bordetella pertussis NOT DETECTED NOT DETECTED Final   Bordetella Parapertussis NOT DETECTED NOT DETECTED Final   Chlamydophila pneumoniae NOT DETECTED NOT DETECTED Final   Mycoplasma pneumoniae NOT DETECTED NOT DETECTED Final    Comment: Performed at [facility], [address], [place], [place] [address]  Resp panel by RT-PCR (RSV, Flu A&B, Covid) Anterior Nasal Swab     Status: None   Collection Time: 02/27/24 11:40 AM   Specimen: Anterior Nasal Swab  Result Value Ref Range Status   SARS Coronavirus 2 by RT PCR NEGATIVE NEGATIVE Final   Influenza A by PCR NEGATIVE NEGATIVE Final   Influenza B by PCR NEGATIVE NEGATIVE Final    Comment: (NOTE) The Xpert Xpress SARS-CoV-2/FLU/RSV plus assay is intended as an aid in the diagnosis of influenza from Nasopharyngeal swab specimens and should not be used as a sole basis for treatment. Nasal washings and aspirates are unacceptable for Xpert Xpress SARS-CoV-2/FLU/RSV testing.  Fact Sheet for Patients: BloggerCourse.com  Fact Sheet for Healthcare Providers: SeriousBroker.it  This test is not yet approved or cleared by the [place]  FDA and has been authorized for detection and/or diagnosis of SARS-CoV-2 by FDA under an Emergency Use Authorization (EUA). This EUA will remain in effect (meaning this test can be used) for the duration of the COVID-19 declaration under Section 564(b)(1) of the Act, 21 U.S.C. section 360bbb-3(b)(1), unless the authorization is terminated or revoked.     Resp Syncytial Virus by PCR NEGATIVE NEGATIVE Final    Comment: (NOTE) Fact  Sheet for Patients: BloggerCourse.com  Fact Sheet for Healthcare Providers: SeriousBroker.it  This test is not yet approved or cleared by the [place]   FDA and has been authorized for detection and/or diagnosis of SARS-CoV-2 by FDA under an Emergency Use Authorization (EUA). This EUA will remain in effect (meaning this test can be used) for the duration of the COVID-19 declaration under Section 564(b)(1) of the Act, 21 U.S.C. section 360bbb-3(b)(1), unless the authorization is terminated or revoked.  Performed at [facility], [address] [place], [place] [address]     FURTHER DISCHARGE INSTRUCTIONS:  Get Medicines reviewed and adjusted: Please take all your medications with you for your next visit with your Primary MD  Laboratory/radiological data: Please request your Primary MD to go over all hospital tests and procedure/radiological results at the follow up, please ask your Primary MD to get all Hospital records sent to his/her office.  In some cases, they will be blood work, cultures and biopsy results pending at the time of your discharge. Please request that your primary care M.D. goes through all the records of your hospital data and follows up on these results.  Also Note the following: If you experience worsening of your admission symptoms, develop shortness of breath, life threatening emergency, suicidal or homicidal thoughts you must seek medical attention immediately by calling 911 or calling your MD immediately  if symptoms less severe.  You must read complete instructions/literature along with all the possible adverse reactions/side effects for all the Medicines you take and that have been prescribed to you. Take any new Medicines after you have completely understood and accpet all the possible adverse reactions/side effects.   Do not drive when taking Pain medications or sleeping medications  (Benzodaizepines)  Do not take more than prescribed Pain, Sleep and Anxiety Medications. It is not advisable to combine anxiety,sleep and pain medications without talking with your primary care practitioner  Special Instructions: If you have smoked or chewed Tobacco  in the last 2 yrs please stop smoking, stop any regular Alcohol  and or any Recreational drug use.  Wear Seat belts while driving.  Please note: You were cared for by a hospitalist during your hospital stay. Once you are discharged, your primary care physician will handle any further medical issues. Please note that NO REFILLS for any discharge medications will be authorized once you are discharged, as it is imperative that you return to your primary care physician (or establish a relationship with a primary care physician if you do not have one) for your post hospital discharge needs so that they can reassess your need for medications and monitor your lab values.  Total Time spent coordinating discharge including counseling, education and face to face time equals greater than 30 minutes.  SignedKimberly Penna 02/29/2024 9:09 AM

## 2024-02-29 NOTE — Plan of Care (Signed)

## 2024-03-02 ENCOUNTER — Encounter (HOSPITAL_BASED_OUTPATIENT_CLINIC_OR_DEPARTMENT_OTHER): Payer: Self-pay

## 2024-03-03 ENCOUNTER — Ambulatory Visit: Admitting: Internal Medicine

## 2024-03-03 ENCOUNTER — Encounter: Payer: Self-pay | Admitting: Internal Medicine

## 2024-03-03 VITALS — BP 110/78 | HR 60 | Temp 98.5°F | Ht 73.0 in | Wt 196.2 lb

## 2024-03-03 DIAGNOSIS — J309 Allergic rhinitis, unspecified: Secondary | ICD-10-CM | POA: Diagnosis not present

## 2024-03-03 DIAGNOSIS — Z5739 Occupational exposure to other air contaminants: Secondary | ICD-10-CM | POA: Diagnosis not present

## 2024-03-03 DIAGNOSIS — J209 Acute bronchitis, unspecified: Secondary | ICD-10-CM

## 2024-03-03 LAB — TETANUS ANTIBODY, IGG: Tetanus Ab, IgG: 0.68 [IU]/mL (ref <0.10)

## 2024-03-03 NOTE — Patient Instructions (Addendum)
 Continue with BREO Continue PREDNISONE  AS PRESCRIBED Use albuterol  inhaler as needed USE FLONASE  2 sprays each nostril Continue Claritin  as needed Obtain breathing Test Methacholine Challenge Test Check LABS-CBC with DIFF and IgE levels  NYQUIL AT NIGHT over the counter for cough and congestion  Avoid Allergens and Irritants Avoid secondhand smoke Avoid SICK contacts Recommend  Masking  when appropriate Recommend Keep up-to-date with vaccinations

## 2024-03-03 NOTE — Progress Notes (Signed)
 [facility] Pulmonary Medicine Consultation      Date: 03/03/2024,   MRN# [id number] [name] [birth year]     CHIEF COMPLAINT:   Assessment of Bronchitis   HISTORY OF PRESENT ILLNESS   38 year old pleasant white male seen today for acute bronchitis Patient was seen in the ER with increased shortness of breath wheezing chest congestion patient was diagnosed with acute bronchitis COVID flu RSV were negative Chest x-ray obtained did not show any significant findings  In the last 7 years patient has had acute bronchitis twice a year every spring and every fall Patient responds well to prednisone  and antibiotics Patient states that anytime he has an acute bronchitis his cough lingers  Patient is a non-smoker Occasional alcohol No secondhand smoke exposure 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  Patient was started on Breo inhaler which seems to have helped there is more coughing and more productive phlegm Uses albuterol  inhaler No history of childhood asthma No history of GERD Patient had a spontaneous pneumothorax at the age of 70 no chest tube required   No exacerbation at this time No evidence of heart failure at this time No evidence or signs of infection at this time No respiratory distress No fevers, chills, nausea, vomiting, diarrhea No evidence of lower extremity edema No evidence hemoptysis   Chest x-ray April 2025 reviewed independently reviewed by me today No significant abnormalities No effusions No pneumonia No edema No significant acute findings  PAST MEDICAL HISTORY   No past medical history on file.   SURGICAL HISTORY   Past Surgical History:  Procedure Laterality Date   ANKLE SURGERY     HERNIA REPAIR     TYMPANOSTOMY TUBE PLACEMENT       FAMILY HISTORY   No family history on file.   SOCIAL HISTORY   Social History   Tobacco Use   Smoking status: Never   Smokeless  tobacco: Never  Substance Use Topics   Alcohol use: Yes    Comment: occasional   Drug use: No     MEDICATIONS    Home Medication:  Current Outpatient Rx   Order #: [id number] Class: Normal   Order #: [id number] Class: Normal   Order #: [id number] Class: Normal   Order #: [id number] Class: Normal   Order #: [id number] Class: Normal   Order #: [id number] Class: Normal    Current Medication:  Current Outpatient Medications:    albuterol  (VENTOLIN  HFA) 108 (90 Base) MCG/ACT inhaler, Inhale 2 puffs into the lungs every 4 (four) hours as needed for wheezing or shortness of breath., Disp: 6.7 g, Rfl: 1   cyclobenzaprine  (FLEXERIL ) 5 MG tablet, Take 1 tablet (5 mg total) by mouth 3 (three) times daily as needed for muscle spasms., Disp: 30 tablet, Rfl: 0   fluticasone  (FLONASE ) 50 MCG/ACT nasal spray, Place 2 sprays into both nostrils daily., Disp: 16 g, Rfl: 1   fluticasone  furoate-vilanterol (BREO ELLIPTA ) 200-25 MCG/ACT AEPB, Inhale 1 puff into the lungs daily., Disp: 60 each, Rfl: 1   loratadine  (CLARITIN ) 10 MG tablet, Take 1 tablet (10 mg total) by mouth daily., Disp: 14 tablet, Rfl: 0   predniSONE  (DELTASONE ) 10 MG tablet, Take 3 tablets (30 mg total) daily for 1 day, then 2 tablets (20 mg total) daily for 1 day, then 1 tablet (10 mg total) daily for 1 day, then stop., Disp: 6 tablet, Rfl: 0    ALLERGIES   Mucinex [guaifenesin er] and Sulfonamide derivatives  BP 110/78 (  BP Location: Right Arm, Patient Position: Sitting, Cuff Size: Normal)   Pulse 60   Temp 98.5 F (36.9 C) (Oral)   Ht 6\' 1"  (1.854 m)   Wt 196 lb 3.2 oz (89 kg)   SpO2 96%   BMI 25.89 kg/m    Review of Systems: Gen:  Denies  fever, sweats, chills weight loss  HEENT: Denies blurred vision, double vision, ear pain, eye pain, hearing loss, nose bleeds, sore throat Cardiac:  No dizziness, chest pain or heaviness, chest tightness,edema, No JVD Resp:   No cough, -sputum production, +shortness of breath,+wheezing,  -hemoptysis,  Other:  All other systems negative   Physical Examination:   General Appearance: No distress  EYES PERRLA, EOM intact.   NECK Supple, No JVD Pulmonary: normal breath sounds, No wheezing.  CardiovascularNormal S1,S2.  No m/r/g.   Abdomen: Benign, Soft, non-tender. Neurology UE/LE 5/5 strength, no focal deficits Ext pulses intact, cap refill intact ALL OTHER ROS ARE NEGATIVE  CBC    Component Value Date/Time   WBC 7.4 02/28/2024 0600   RBC 4.15 (L) 02/28/2024 0600   HGB 12.9 (L) 02/28/2024 0600   HCT 39.2 02/28/2024 0600   PLT 206 02/28/2024 0600   MCV 94.5 02/28/2024 0600   MCH 31.1 02/28/2024 0600   MCHC 32.9 02/28/2024 0600   RDW 12.4 02/28/2024 0600   LYMPHSABS 0.8 07/09/2009 2042   MONOABS 0.5 07/09/2009 2042   EOSABS 0.1 07/09/2009 2042   BASOSABS 0.0 07/09/2009 2042      Latest Ref Rng & Units 02/28/2024    6:00 AM 02/27/2024    9:55 AM 02/26/2024   11:25 PM  BMP  Glucose 70 - 99 mg/dL 95  960  89   BUN 6 - 20 mg/dL 9  <5  4   Creatinine 4.54 - 1.24 mg/dL 0.98  1.19  1.47   Sodium 135 - 145 mmol/L 139  140  140   Potassium 3.5 - 5.1 mmol/L 4.0  4.2  3.2   Chloride 98 - 111 mmol/L 108  108  105   CO2 22 - 32 mmol/L 26  22    Calcium  8.9 - 10.3 mg/dL 8.7  9.2         IMAGING    DG Chest Port 1 View Result Date: 02/26/2024 CLINICAL DATA:  SOB EXAM: PORTABLE CHEST 1 VIEW COMPARISON:  Chest x-ray 09/21/2018 FINDINGS: The heart and mediastinal contours are within normal limits. No focal consolidation. No pulmonary edema. No pleural effusion. No pneumothorax. No acute osseous abnormality. IMPRESSION: No active disease. Electronically Signed   By: Morgane  Naveau M.D.   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  Assessment of acute bronchitis Recommend CBC with differential Recommend IgE levels I would recommend methacholine challenge test with pulmonary function testing Continue Breo as prescribed rinse mouth after use Continue to use albuterol  as needed  For allergic rhinitis Continue Flonase  2 sprays each nostril daily Recommend continuing with Claritin  Avoid Allergens and Irritants Avoid secondhand smoke Avoid SICK contacts Recommend  Masking  when appropriate Recommend Keep up-to-date with vaccinations    MEDICATION ADJUSTMENTS/LABS AND TESTS ORDERED: Continue with BREO Continue PREDNISONE  AS PRESCRIBED Use albuterol  inhaler as needed USE FLONASE  2 sprays each nostril Continue Claritin  as  needed Obtain breathing Test Methacholine Challenge Test Check LABS-CBC with DIFF and IgE levels NYQUIL AT NIGHT over the counter for cough and congestion  CURRENT MEDICATIONS REVIEWED AT LENGTH WITH PATIENT TODAY   Patient  satisfied with Plan of action and management. All questions answered  Follow up 4-6 weeks  I spent a total of 60 minutes reviewing chart data, face-to-face evaluation with the patient, counseling and coordination of care as detailed above.     Lady Pier, M.D.  [facility]  Medical Director [facility] Medical Director [facility] Cardio-Pulmonary Department

## 2024-03-07 ENCOUNTER — Other Ambulatory Visit: Payer: Self-pay

## 2024-03-07 DIAGNOSIS — J209 Acute bronchitis, unspecified: Secondary | ICD-10-CM

## 2024-03-07 DIAGNOSIS — J309 Allergic rhinitis, unspecified: Secondary | ICD-10-CM

## 2024-03-07 LAB — 25-HYDROXY VITAMIN D LCMS D2+D3
25-Hydroxy, Vitamin D-2: 1 ng/mL
25-Hydroxy, Vitamin D-3: 25 ng/mL
25-Hydroxy, Vitamin D: 26 ng/mL — ABNORMAL LOW

## 2024-03-11 ENCOUNTER — Encounter

## 2024-03-21 ENCOUNTER — Other Ambulatory Visit: Payer: Self-pay

## 2024-03-21 DIAGNOSIS — R0602 Shortness of breath: Secondary | ICD-10-CM

## 2024-03-23 ENCOUNTER — Encounter

## 2024-04-18 ENCOUNTER — Other Ambulatory Visit (HOSPITAL_COMMUNITY): Payer: Self-pay

## 2024-08-22 ENCOUNTER — Other Ambulatory Visit (HOSPITAL_BASED_OUTPATIENT_CLINIC_OR_DEPARTMENT_OTHER): Payer: Self-pay | Admitting: Family Medicine

## 2024-08-22 DIAGNOSIS — Z8249 Family history of ischemic heart disease and other diseases of the circulatory system: Secondary | ICD-10-CM

## 2024-09-07 ENCOUNTER — Ambulatory Visit (HOSPITAL_BASED_OUTPATIENT_CLINIC_OR_DEPARTMENT_OTHER)
Admission: RE | Admit: 2024-09-07 | Discharge: 2024-09-07 | Disposition: A | Discharge status: Home / Self Care | Payer: Self-pay | Source: Ambulatory Visit | Attending: Family Medicine | Admitting: Family Medicine

## 2024-09-07 DIAGNOSIS — Z8249 Family history of ischemic heart disease and other diseases of the circulatory system: Secondary | ICD-10-CM

## 2024-11-11 ENCOUNTER — Ambulatory Visit (HOSPITAL_COMMUNITY)
Admission: RE | Admit: 2024-11-11 | Discharge: 2024-11-11 | Disposition: A | Discharge status: Home / Self Care | Source: Ambulatory Visit

## 2024-11-11 ENCOUNTER — Encounter (HOSPITAL_COMMUNITY): Payer: Self-pay

## 2024-11-11 VITALS — BP 128/87 | HR 80 | Temp 99.0°F | Resp 16 | Ht 73.0 in | Wt 197.0 lb

## 2024-11-11 DIAGNOSIS — Z23 Encounter for immunization: Secondary | ICD-10-CM

## 2024-11-11 DIAGNOSIS — Z203 Contact with and (suspected) exposure to rabies: Secondary | ICD-10-CM | POA: Diagnosis not present

## 2024-11-11 DIAGNOSIS — W5501XA Bitten by cat, initial encounter: Secondary | ICD-10-CM | POA: Diagnosis not present

## 2024-11-11 MED ORDER — RABIES IMMUNE GLOBULIN 300 UNIT/2ML IJ SOLN
20.0000 [IU]/kg | Freq: Once | INTRAMUSCULAR | Status: AC
  Administered 2024-11-11: 1800 [IU] via INTRAMUSCULAR

## 2024-11-11 MED ORDER — RABIES IMMUNE GLOBULIN 300 UNIT/2ML IJ SOLN
INTRAMUSCULAR | Status: AC
  Filled 2024-11-11: qty 2

## 2024-11-11 MED ORDER — RABIES VIRUS VACCINE, HDC IM SUSR
INTRAMUSCULAR | Status: AC
  Filled 2024-11-11: qty 1

## 2024-11-11 MED ORDER — RABIES VIRUS VACCINE, HDC IM SUSR
1.0000 mL | Freq: Once | INTRAMUSCULAR | Status: AC
  Administered 2024-11-11: 1 mL via INTRAMUSCULAR

## 2024-11-11 NOTE — Discharge Instructions (Addendum)
" °  1. Cat bite, initial encounter (Primary) 2. Need for post exposure prophylaxis for rabies - rabies immune globulin  (HYPERRAB) injection 1,800 Units given in UC for acute rabies prophylaxis - rabies vaccine , human diploid (IMOVAX) injection 1 mL given in UC for acute rabies prophylaxis - Follow-up in 3 days for second rabies vaccination here in urgent care.  You will also receive injections on day 7 and day 14 for entire course for rabies prophylaxis.  -Continue to monitor symptoms for any change in severity if there is any escalation of current symptoms or development of new symptoms follow-up in ER for further evaluation and management. "

## 2024-11-11 NOTE — ED Provider Notes (Signed)
 " [facility] [place]  Note:  This document was prepared using Dragon voice recognition software and may include unintentional dictation errors.  MRN: [id number] DOB: 1986/07/25  Subjective:   [name] is a 39 y.o. 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  Patient is unsure if the cat that scratched him is positive but was advised by animal control to come in for evaluation and vaccination for rabies.  Patient denies any bites but states that he was scratched on the right forearm and has small superficial laceration.  Patient denies any pain or other symptoms associated with rabies.  Current Medications[1]   Allergies[2]  History reviewed. No pertinent past medical history.   Past Surgical History:  Procedure Laterality Date   ANKLE SURGERY     HERNIA REPAIR     TYMPANOSTOMY TUBE PLACEMENT      History reviewed. No pertinent family history.  Social History[3]  ROS Refer to HPI for ROS details.  Objective:    Vitals: BP 128/87 (BP Location: Right Arm)   Pulse 80   Temp 99 F (37.2 C) (Oral)   Resp 16   Ht 6' 1 (1.854 m)   Wt 197 lb (89.4 kg)   SpO2 98%   BMI 25.99 kg/m   Physical Exam Vitals and nursing note reviewed.  Constitutional:      General: He is not in acute distress.    Appearance: Normal appearance. He is well-developed. He is not ill-appearing or toxic-appearing.  HENT:     Head: Normocephalic.  Cardiovascular:     Rate and Rhythm: Normal rate.  Pulmonary:     Effort: Pulmonary effort is normal. No respiratory distress.     Breath sounds: No stridor. No wheezing.  Skin:    General: Skin is warm and dry.     Findings: Abrasion, erythema and wound present. No abscess, bruising, lesion or rash.      Neurological:      General: No focal deficit present.     Mental Status: He is alert and oriented to person, place, and time.  Psychiatric:        Mood and Affect: Mood normal.        Behavior: Behavior normal.     Procedures  No results found for this or any previous visit (from the past 24 hours).  Assessment and Plan :     Discharge Instructions       1. Cat bite, initial encounter (Primary) 2. Need for post exposure prophylaxis for rabies - rabies immune globulin  (HYPERRAB) injection 1,800 Units - rabies vaccine , human diploid (IMOVAX) injection 1 mL - Follow-up in 3 days for second rabies vaccination here in urgent care.  You will also receive injections on day 7 and day 14 for entire course for rabies prophylaxis.  -Continue to monitor symptoms for any change in severity if there is any escalation of current symptoms or development of new symptoms follow-up in ER for further evaluation and management.      [facility] Norrin Shreffler    [1]  Current Facility-Administered Medications:    rabies immune globulin  (HYPERRAB) injection 1,800 Units, 20 Units/kg, Intramuscular, Once, Tywana Robotham B, NP   rabies vaccine , human diploid (IMOVAX) injection 1 mL, 1 mL, Intramuscular, Once, Trinetta Alemu B, NP  Current Outpatient Medications:    allopurinol  (ZYLOPRIM ) 100 MG tablet, Take 200 mg by mouth daily., Disp: , Rfl:    meloxicam  (MOBIC ) 15 MG tablet, Take 15 mg by mouth daily., Disp: , Rfl:    albuterol  (VENTOLIN  HFA) 108 (90 Base) MCG/ACT inhaler, Inhale 2 puffs into the lungs every 4 (four) hours as needed for wheezing or shortness of breath., Disp: 6.7 g, Rfl: 1   fluticasone  (FLONASE ) 50 MCG/ACT nasal spray, Place 2 sprays into both nostrils daily., Disp: 16 g, Rfl: 1   fluticasone  furoate-vilanterol (BREO ELLIPTA ) 200-25 MCG/ACT AEPB, Inhale 1 puff into the lungs daily., Disp: 60 each, Rfl: 1   loratadine  (CLARITIN ) 10 MG tablet, Take 1 tablet (10 mg total) by mouth daily.,  Disp: 14 tablet, Rfl: 0 [2]  Allergies Allergen Reactions   Mucinex [Guaifenesin Er] Rash   Sulfonamide Derivatives     REACTION: swelling and rash as a child  [3]  Social History Tobacco Use   Smoking status: Never   Smokeless tobacco: Current    Types: Chew  Substance Use Topics   Alcohol use: Yes    Comment: occasional   Drug use: No     Aurea Goodell B, NP 11/11/24 1809  "

## 2024-11-11 NOTE — ED Triage Notes (Signed)
 Patient states his neighbors cat bit 2 kids and scratched him 2 days ago. The cat tested positive for rabies. States there was an initial run in with the same cat on 10/27/24 but no blood was drawn that time.   Patient denies any current symptoms. Scratch along the inner right wrist.

## 2024-11-14 ENCOUNTER — Ambulatory Visit (HOSPITAL_COMMUNITY): Admit: 2024-11-14 | Discharge: 2024-11-14

## 2024-11-14 ENCOUNTER — Encounter (HOSPITAL_COMMUNITY): Payer: Self-pay

## 2024-11-14 DIAGNOSIS — Z23 Encounter for immunization: Secondary | ICD-10-CM | POA: Diagnosis not present

## 2024-11-14 DIAGNOSIS — Z203 Contact with and (suspected) exposure to rabies: Secondary | ICD-10-CM | POA: Diagnosis not present

## 2024-11-14 MED ORDER — RABIES VIRUS VACCINE, HDC IM SUSR
1.0000 mL | Freq: Once | INTRAMUSCULAR | Status: AC
  Administered 2024-11-14: 1 mL via INTRAMUSCULAR

## 2024-11-14 MED ORDER — RABIES VIRUS VACCINE, HDC IM SUSR
INTRAMUSCULAR | Status: AC
  Filled 2024-11-14: qty 1

## 2024-11-14 NOTE — ED Triage Notes (Signed)
 Pt here for 2nd rabies vaccination. Denies any complications from first set.

## 2024-11-18 ENCOUNTER — Encounter (HOSPITAL_COMMUNITY): Payer: Self-pay

## 2024-11-18 ENCOUNTER — Ambulatory Visit (HOSPITAL_COMMUNITY)
Admission: EM | Admit: 2024-11-18 | Discharge: 2024-11-18 | Disposition: A | Discharge status: Home / Self Care | Attending: Family Medicine | Admitting: Family Medicine

## 2024-11-18 DIAGNOSIS — Z203 Contact with and (suspected) exposure to rabies: Secondary | ICD-10-CM

## 2024-11-18 DIAGNOSIS — Z23 Encounter for immunization: Secondary | ICD-10-CM

## 2024-11-18 MED ORDER — RABIES VIRUS VACCINE, HDC IM SUSR
1.0000 mL | Freq: Once | INTRAMUSCULAR | Status: AC
  Administered 2024-11-18: 1 mL via INTRAMUSCULAR

## 2024-11-18 MED ORDER — RABIES VIRUS VACCINE, HDC IM SUSR
INTRAMUSCULAR | Status: AC
  Filled 2024-11-18: qty 1

## 2024-11-18 NOTE — ED Triage Notes (Signed)
 Patient here today for Day 7 rabies vaccine . Patient is doing well.

## 2024-11-25 ENCOUNTER — Ambulatory Visit (HOSPITAL_COMMUNITY)
Admission: EM | Admit: 2024-11-25 | Discharge: 2024-11-25 | Disposition: A | Discharge status: Home / Self Care | Attending: Internal Medicine | Admitting: Internal Medicine

## 2024-11-25 DIAGNOSIS — Z203 Contact with and (suspected) exposure to rabies: Secondary | ICD-10-CM

## 2024-11-25 MED ORDER — RABIES VIRUS VACCINE, HDC IM SUSR
INTRAMUSCULAR | Status: AC
  Filled 2024-11-25: qty 1

## 2024-11-25 MED ORDER — RABIES VIRUS VACCINE, HDC IM SUSR
1.0000 mL | Freq: Once | INTRAMUSCULAR | Status: AC
  Administered 2024-11-25: 1 mL via INTRAMUSCULAR

## 2024-11-25 NOTE — ED Triage Notes (Signed)
Patient is here for last rabies vaccine.
